# Patient Record
Sex: Female | Born: 1950
Health system: Southern US, Community
[De-identification: ages and names within clinical notes are randomized; demographics above are authoritative.]

## PROBLEM LIST (undated history)

## (undated) DIAGNOSIS — R112 Nausea with vomiting, unspecified: Secondary | ICD-10-CM

## (undated) DIAGNOSIS — F329 Major depressive disorder, single episode, unspecified: Secondary | ICD-10-CM

## (undated) DIAGNOSIS — F32A Depression, unspecified: Secondary | ICD-10-CM

## (undated) DIAGNOSIS — Z9889 Other specified postprocedural states: Secondary | ICD-10-CM

## (undated) DIAGNOSIS — I10 Essential (primary) hypertension: Secondary | ICD-10-CM

## (undated) HISTORY — PX: OTHER SURGICAL HISTORY: SHX169

## (undated) HISTORY — PX: MENISCUS REPAIR: SHX5179

## (undated) HISTORY — PX: GANGLION CYST EXCISION: SHX1691

## (undated) HISTORY — PX: ABDOMINAL HYSTERECTOMY: SHX81

## (undated) HISTORY — PX: FINGER SURGERY: SHX640

---

## 2002-10-31 ENCOUNTER — Encounter (HOSPITAL_COMMUNITY): Admission: RE | Admit: 2002-10-31 | Discharge: 2003-01-29 | Payer: Self-pay | Admitting: Internal Medicine

## 2002-11-01 ENCOUNTER — Encounter: Payer: Self-pay | Admitting: Internal Medicine

## 2011-01-04 ENCOUNTER — Ambulatory Visit
Admission: RE | Admit: 2011-01-04 | Discharge: 2011-01-04 | Payer: Self-pay | Source: Home / Self Care | Attending: Orthopedic Surgery | Admitting: Orthopedic Surgery

## 2011-01-05 LAB — POCT I-STAT, CHEM 8
BUN: 13 mg/dL (ref 6–23)
Calcium, Ion: 1.05 mmol/L — ABNORMAL LOW (ref 1.12–1.32)
Chloride: 110 mEq/L (ref 96–112)
Creatinine, Ser: 0.6 mg/dL (ref 0.4–1.2)
Glucose, Bld: 96 mg/dL (ref 70–99)
HCT: 38 % (ref 36.0–46.0)
Hemoglobin: 12.9 g/dL (ref 12.0–15.0)
Potassium: 4 mEq/L (ref 3.5–5.1)
Sodium: 139 mEq/L (ref 135–145)
TCO2: 24 mmol/L (ref 0–100)

## 2011-01-31 NOTE — Op Note (Signed)
NAME:  SOYLA, BAINTER NO.:  [id number]  MEDICAL RECORD NO.:  [id number]          PATIENT TYPE:  AMB  LOCATION:  DSC                          FACILITY:  [facility]  PHYSICIAN:  Cindee Salt, M.D.       DATE OF BIRTH:  [birth year]  DATE OF PROCEDURE:  01/04/2011 DATE OF DISCHARGE:                              OPERATIVE REPORT   PREOPERATIVE DIAGNOSIS:  Mucoid tumor, right index finger.  POSTOPERATIVE DIAGNOSIS:  Mucoid tumor, right index finger.  OPERATION:  Excisional biopsy of mucoid tumor with debridement of the distal interphalangeal joint, right index finger.  SURGEON:  Cindee Salt, MD  ASSISTANT:  Betha Loa, MD  ANESTHESIA:  Forearm-based IV regional with local infiltration, metacarpal block.  ANESTHESIOLOGIST:  Maren Beach, MD  HISTORY:  The patient is a 60 year old female with a large mass over the distal aspect of her right index finger radial side with significant deformity of her nail plate.  X-rays reveal degenerative changes of the distal interphalangeal joint, this does transilluminate.  She has elected to undergo surgical excision with debridement of the joint in an effort to try to prevent recurrent.  She is aware of risks and complications including infection, recurrence of injury to arteries, nerves, tendons, incomplete relief of symptoms, and dystrophy.  In the preoperative area, the patient was seen, the extremity was marked by both the patient and surgeon, and antibiotic given.  PROCEDURE:  The patient was brought to the operating room where a forearm-based IV regional anesthetic was carried out without difficulty under the direction of Dr. Katrinka Blazing.  She was prepped using ChloraPrep, supine position, right arm free.  A 3-minute dry time was allowed.  Time- out taken confirming the patient and procedure.  A curvilinear incision was made over the distal interphalangeal joint, radial aspect of the right index finger carried down through  the subcutaneous tissue. Bleeders were electrocauterized with bipolar.  A large cystic mass was medially encountered distally, this was followed distally, removed with a House curette and hemostat rongeur.  The joint was opened on its radial aspect.  A very significant synovitis with large osteophyte was present around the distal phalanx with some large osteophyte present over the dorsal aspect of the proximal phalanx.  These each were excised with the small rongeur until smooth level to the joint.  The large traction osteophyte on the extensor tendon was not disrupted.  The wound was copiously irrigated with saline.  Specimen was sent to pathology. The joint was then closed with figure-of-eight 6-0 chromic sutures and the skin with interrupted 5-0 Vicryl Rapide sutures.  Sterile compressive dressing, splint to the finger was applied.  On deflation of the tourniquet, all fingers were immediately pinked.  Prior to application of the dressing, a metacarpal block was given with 0.25% Marcaine without epinephrine, 5 mL was used.  The patient tolerated the procedure well and was taken to the recovery room for observation in satisfactory condition.  She will be discharged home to return to the [facility] in 1 week, on Vicodin.          ______________________________ Cindee Salt, M.D.  GK/MEDQ  D:  01/04/2011  T:  01/05/2011  Job:  [id number]  cc:   Dr. Edwinna Areola  Electronically Signed by Cindee Salt M.D. on 01/31/2011 02:24:03 PM

## 2014-12-12 HISTORY — PX: CHOLECYSTECTOMY: SHX55

## 2015-09-21 ENCOUNTER — Other Ambulatory Visit: Payer: Self-pay

## 2017-03-16 DIAGNOSIS — G8929 Other chronic pain: Secondary | ICD-10-CM | POA: Insufficient documentation

## 2017-03-16 DIAGNOSIS — M25511 Pain in right shoulder: Secondary | ICD-10-CM | POA: Insufficient documentation

## 2018-01-24 DIAGNOSIS — H6982 Other specified disorders of Eustachian tube, left ear: Secondary | ICD-10-CM | POA: Diagnosis not present

## 2018-02-01 DIAGNOSIS — Z8709 Personal history of other diseases of the respiratory system: Secondary | ICD-10-CM | POA: Diagnosis not present

## 2018-02-01 DIAGNOSIS — H938X2 Other specified disorders of left ear: Secondary | ICD-10-CM | POA: Diagnosis not present

## 2018-02-01 DIAGNOSIS — J342 Deviated nasal septum: Secondary | ICD-10-CM | POA: Diagnosis not present

## 2018-02-12 DIAGNOSIS — J0100 Acute maxillary sinusitis, unspecified: Secondary | ICD-10-CM | POA: Diagnosis not present

## 2018-02-12 DIAGNOSIS — J209 Acute bronchitis, unspecified: Secondary | ICD-10-CM | POA: Diagnosis not present

## 2018-03-08 DIAGNOSIS — M1712 Unilateral primary osteoarthritis, left knee: Secondary | ICD-10-CM | POA: Diagnosis not present

## 2018-03-08 DIAGNOSIS — G8929 Other chronic pain: Secondary | ICD-10-CM | POA: Diagnosis not present

## 2018-03-09 DIAGNOSIS — G8929 Other chronic pain: Secondary | ICD-10-CM | POA: Diagnosis not present

## 2018-03-09 DIAGNOSIS — M1712 Unilateral primary osteoarthritis, left knee: Secondary | ICD-10-CM | POA: Diagnosis not present

## 2018-03-12 HISTORY — PX: COLONOSCOPY: SHX174

## 2018-03-27 DIAGNOSIS — M21162 Varus deformity, not elsewhere classified, left knee: Secondary | ICD-10-CM | POA: Diagnosis not present

## 2018-03-27 DIAGNOSIS — M1712 Unilateral primary osteoarthritis, left knee: Secondary | ICD-10-CM | POA: Diagnosis not present

## 2018-03-27 DIAGNOSIS — G8929 Other chronic pain: Secondary | ICD-10-CM | POA: Diagnosis not present

## 2018-03-27 DIAGNOSIS — M25562 Pain in left knee: Secondary | ICD-10-CM | POA: Diagnosis not present

## 2018-04-05 DIAGNOSIS — Z1211 Encounter for screening for malignant neoplasm of colon: Secondary | ICD-10-CM | POA: Diagnosis not present

## 2018-04-05 DIAGNOSIS — F325 Major depressive disorder, single episode, in full remission: Secondary | ICD-10-CM | POA: Diagnosis not present

## 2018-04-05 DIAGNOSIS — M1712 Unilateral primary osteoarthritis, left knee: Secondary | ICD-10-CM | POA: Diagnosis not present

## 2018-04-05 DIAGNOSIS — Z23 Encounter for immunization: Secondary | ICD-10-CM | POA: Diagnosis not present

## 2018-04-05 DIAGNOSIS — Z79899 Other long term (current) drug therapy: Secondary | ICD-10-CM | POA: Diagnosis not present

## 2018-04-05 DIAGNOSIS — I10 Essential (primary) hypertension: Secondary | ICD-10-CM | POA: Diagnosis not present

## 2018-04-05 DIAGNOSIS — R195 Other fecal abnormalities: Secondary | ICD-10-CM | POA: Diagnosis not present

## 2018-04-05 DIAGNOSIS — E7801 Familial hypercholesterolemia: Secondary | ICD-10-CM | POA: Diagnosis not present

## 2018-04-10 DIAGNOSIS — K921 Melena: Secondary | ICD-10-CM | POA: Insufficient documentation

## 2018-04-10 DIAGNOSIS — Z6837 Body mass index (BMI) 37.0-37.9, adult: Secondary | ICD-10-CM | POA: Insufficient documentation

## 2018-04-12 DIAGNOSIS — Z79899 Other long term (current) drug therapy: Secondary | ICD-10-CM | POA: Diagnosis not present

## 2018-04-12 DIAGNOSIS — F329 Major depressive disorder, single episode, unspecified: Secondary | ICD-10-CM | POA: Diagnosis not present

## 2018-04-12 DIAGNOSIS — K921 Melena: Secondary | ICD-10-CM | POA: Diagnosis not present

## 2018-04-12 DIAGNOSIS — Z8371 Family history of colonic polyps: Secondary | ICD-10-CM | POA: Diagnosis not present

## 2018-04-12 DIAGNOSIS — E669 Obesity, unspecified: Secondary | ICD-10-CM | POA: Diagnosis not present

## 2018-04-12 DIAGNOSIS — Z6837 Body mass index (BMI) 37.0-37.9, adult: Secondary | ICD-10-CM | POA: Diagnosis not present

## 2018-04-12 DIAGNOSIS — K208 Other esophagitis: Secondary | ICD-10-CM | POA: Diagnosis not present

## 2018-04-12 DIAGNOSIS — K209 Esophagitis, unspecified: Secondary | ICD-10-CM | POA: Diagnosis not present

## 2018-04-30 ENCOUNTER — Other Ambulatory Visit: Payer: Self-pay | Admitting: Orthopedic Surgery

## 2018-05-23 NOTE — Pre-Procedure Instructions (Signed)
KELLSIE GRINDLE  05/23/2018      [facility] [place] [place] - [address] [place] [address] Phone: [phone] Fax: [phone]    Your procedure is scheduled on June 24  Report to [facility] Admitting at 0900 A.M.  Call this number if you have problems the morning of surgery:  [phone]   Remember:  NOTHING TO EAT OR DRINK AFTER MIDNIGHT    Take these medicines the morning of surgery with A SIP OF WATER  atenolol (TENORMIN) cetirizine (ZYRTEC)  sertraline (ZOLOFT)  7 days prior to surgery STOP taking any Aspirin(unless otherwise instructed by your surgeon), Aleve, Naproxen, Ibuprofen, Motrin, Advil, Goody's, BC's, all herbal medications, fish oil, and all vitamins   Do not wear jewelry, make-up or nail polish.  Do not wear lotions, powders, or perfumes, or deodorant.  Do not shave 48 hours prior to surgery.   Do not bring valuables to the hospital.  [facility] is not responsible for any belongings or valuables.  Contacts, dentures or bridgework may not be worn into surgery.  Leave your suitcase in the car.  After surgery it may be brought to your room.  For patients admitted to the hospital, discharge time will be determined by your treatment team.  Patients discharged the day of surgery will not be allowed to drive home.    Special instructions:   [place]- Preparing For Surgery  Before surgery, you can play an important role. Because skin is not sterile, your skin needs to be as free of germs as possible. You can reduce the number of germs on your skin by washing with CHG (chlorahexidine gluconate) Soap before surgery.  CHG is an antiseptic cleaner which kills germs and bonds with the skin to continue killing germs even after washing.    Oral Hygiene is also important to reduce your risk of infection.  Remember - BRUSH YOUR TEETH THE MORNING OF SURGERY WITH YOUR REGULAR TOOTHPASTE  Please do not use if you have  an allergy to CHG or antibacterial soaps. If your skin becomes reddened/irritated stop using the CHG.  Do not shave (including legs and underarms) for at least 48 hours prior to first CHG shower. It is OK to shave your face.  Please follow these instructions carefully.   1. Shower the NIGHT BEFORE SURGERY and the MORNING OF SURGERY with CHG.   2. If you chose to wash your hair, wash your hair first as usual with your normal shampoo.  3. After you shampoo, rinse your hair and body thoroughly to remove the shampoo.  4. Use CHG as you would any other liquid soap. You can apply CHG directly to the skin and wash gently with a scrungie or a clean washcloth.   5. Apply the CHG Soap to your body ONLY FROM THE NECK DOWN.  Do not use on open wounds or open sores. Avoid contact with your eyes, ears, mouth and genitals (private parts). Wash Face and genitals (private parts)  with your normal soap.  6. Wash thoroughly, paying special attention to the area where your surgery will be performed.  7. Thoroughly rinse your body with warm water from the neck down.  8. DO NOT shower/wash with your normal soap after using and rinsing off the CHG Soap.  9. Pat yourself dry with a CLEAN TOWEL.  10. Wear CLEAN PAJAMAS to bed the night before surgery, wear comfortable clothes the morning of surgery  11. Place  CLEAN SHEETS on your bed the night of your first shower and DO NOT SLEEP WITH PETS.    Day of Surgery:  Do not apply any deodorants/lotions.  Please wear clean clothes to the hospital/surgery center.   Remember to brush your teeth WITH YOUR REGULAR TOOTHPASTE.    Please read over the following fact sheets that you were given.

## 2018-05-24 ENCOUNTER — Encounter (HOSPITAL_COMMUNITY)
Admission: RE | Admit: 2018-05-24 | Discharge: 2018-05-24 | Disposition: A | Discharge status: Home / Self Care | Payer: Medicare Other | Source: Ambulatory Visit | Attending: Orthopedic Surgery | Admitting: Orthopedic Surgery

## 2018-05-24 ENCOUNTER — Other Ambulatory Visit: Payer: Self-pay

## 2018-05-24 ENCOUNTER — Encounter (HOSPITAL_COMMUNITY): Payer: Self-pay | Admitting: *Deleted

## 2018-05-24 DIAGNOSIS — M1712 Unilateral primary osteoarthritis, left knee: Secondary | ICD-10-CM | POA: Insufficient documentation

## 2018-05-24 DIAGNOSIS — Z01818 Encounter for other preprocedural examination: Secondary | ICD-10-CM | POA: Diagnosis not present

## 2018-05-24 DIAGNOSIS — Z01810 Encounter for preprocedural cardiovascular examination: Secondary | ICD-10-CM | POA: Insufficient documentation

## 2018-05-24 HISTORY — DX: Other specified postprocedural states: R11.2

## 2018-05-24 HISTORY — DX: Major depressive disorder, single episode, unspecified: F32.9

## 2018-05-24 HISTORY — DX: Essential (primary) hypertension: I10

## 2018-05-24 HISTORY — DX: Other specified postprocedural states: Z98.890

## 2018-05-24 HISTORY — DX: Depression, unspecified: F32.A

## 2018-05-24 LAB — COMPREHENSIVE METABOLIC PANEL
ALBUMIN: 4 g/dL (ref 3.5–5.0)
ALT: 23 U/L (ref 14–54)
AST: 23 U/L (ref 15–41)
Alkaline Phosphatase: 64 U/L (ref 38–126)
Anion gap: 6 (ref 5–15)
BUN: 13 mg/dL (ref 6–20)
CHLORIDE: 107 mmol/L (ref 101–111)
CO2: 27 mmol/L (ref 22–32)
Calcium: 9.2 mg/dL (ref 8.9–10.3)
Creatinine, Ser: 0.7 mg/dL (ref 0.44–1.00)
GFR calc Af Amer: >60 mL/min (ref >60)
GFR calc non Af Amer: >60 mL/min (ref >60)
GLUCOSE: 97 mg/dL (ref 65–99)
POTASSIUM: 4.4 mmol/L (ref 3.5–5.1)
Sodium: 140 mmol/L (ref 135–145)
Total Bilirubin: 1.1 mg/dL (ref 0.3–1.2)
Total Protein: 7.3 g/dL (ref 6.5–8.1)

## 2018-05-24 LAB — CBC WITH DIFFERENTIAL/PLATELET
Abs Immature Granulocytes: 0 10*3/uL (ref 0.0–0.1)
BASOS PCT: 1 %
Basophils Absolute: 0 10*3/uL (ref 0.0–0.1)
EOS ABS: 0.2 10*3/uL (ref 0.0–0.7)
Eosinophils Relative: 4 %
HCT: 41.2 % (ref 36.0–46.0)
Hemoglobin: 13.3 g/dL (ref 12.0–15.0)
IMMATURE GRANULOCYTES: 0 %
LYMPHS ABS: 1.7 10*3/uL (ref 0.7–4.0)
Lymphocytes Relative: 30 %
MCH: 30 pg (ref 26.0–34.0)
MCHC: 32.3 g/dL (ref 30.0–36.0)
MCV: 93 fL (ref 78.0–100.0)
Monocytes Absolute: 0.5 10*3/uL (ref 0.1–1.0)
Monocytes Relative: 9 %
NEUTROS PCT: 56 %
Neutro Abs: 3.2 10*3/uL (ref 1.7–7.7)
PLATELETS: 256 10*3/uL (ref 150–400)
RBC: 4.43 MIL/uL (ref 3.87–5.11)
RDW: 11.6 % (ref 11.5–15.5)
WBC: 5.6 10*3/uL (ref 4.0–10.5)

## 2018-05-24 LAB — SURGICAL PCR SCREEN
MRSA, PCR: NEGATIVE
Staphylococcus aureus: NEGATIVE

## 2018-05-24 NOTE — Progress Notes (Signed)
PCP:  Dr. Cecille Amsterdam in [place], [place]  No cardiologist

## 2018-05-25 NOTE — Progress Notes (Signed)
Anesthesia Chart Review:   Case:  [id number] Date/Time:  06/04/18 1100   Procedure:  TOTAL KNEE ARTHROPLASTY (Left )   Anesthesia type:  Spinal   Pre-op diagnosis:  primary osteoarthritis left knee   Location:  [facility] OR ROOM 06 / [facility]   Surgeon:  Vickey Huger, MD      DISCUSSION: - Pt is a 67 year old female with hx HTN.   VS: BP 130/73   Pulse (!) 56   Temp 36.8 C (Oral)   Resp 20   Ht 5\' 9"  (1.753 m)   Wt 248 lb 3.8 oz (112.6 kg)   SpO2 97%   BMI 36.66 kg/m   PROVIDERS: PCP is  [place], Marshall Cork., MD   LABS: Labs reviewed: Acceptable for surgery. (all labs ordered are listed, but only abnormal results are displayed)  Labs Reviewed  SURGICAL PCR SCREEN  CBC WITH DIFFERENTIAL/PLATELET  COMPREHENSIVE METABOLIC PANEL    EKG 9/98/33: Sinus bradycardia (57 bpm). Minimal voltage criteria for LVH, may be normal variant. Possible Septal infarct, age undetermined -- poor R wave progression may be due to lead placement   Past Medical History:  Diagnosis Date  . Depression   . Hypertension   . PONV (postoperative nausea and vomiting)     Past Surgical History:  Procedure Laterality Date  . ABDOMINAL HYSTERECTOMY    . bladder tack x2    . CHOLECYSTECTOMY  2016  . COLONOSCOPY  03/2018  . FINGER SURGERY Right    cyst removed from index finger  . GANGLION CYST EXCISION Left    wrist, forearm  . MENISCUS REPAIR Left     MEDICATIONS: . atenolol (TENORMIN) 50 MG tablet  . atorvastatin (LIPITOR) 10 MG tablet  . cetirizine (ZYRTEC) 10 MG tablet  . ibuprofen (ADVIL,MOTRIN) 200 MG tablet  . naphazoline-glycerin (CLEAR EYES REDNESS RELIEF) 0.012-0.2 % SOLN  . sertraline (ZOLOFT) 100 MG tablet   No current facility-administered medications for this encounter.     If no changes, I anticipate pt can proceed with surgery as scheduled.   Willeen Cass, FNP-BC [facility] Short Stay Surgical Center/Anesthesiology Phone: [phone] 05/25/2018 1:58 PM

## 2018-06-01 MED ORDER — TRANEXAMIC ACID 1000 MG/10ML IV SOLN
1000.0000 mg | INTRAVENOUS | Status: AC
  Administered 2018-06-04: 1000 mg via INTRAVENOUS
  Filled 2018-06-01: qty 1100

## 2018-06-01 MED ORDER — BUPIVACAINE LIPOSOME 1.3 % IJ SUSP
20.0000 mL | Freq: Once | INTRAMUSCULAR | Status: DC
  Filled 2018-06-01: qty 20

## 2018-06-04 ENCOUNTER — Ambulatory Visit (HOSPITAL_COMMUNITY): Payer: Medicare Other | Admitting: Emergency Medicine

## 2018-06-04 ENCOUNTER — Ambulatory Visit (HOSPITAL_COMMUNITY): Payer: Medicare Other | Admitting: Anesthesiology

## 2018-06-04 ENCOUNTER — Encounter (HOSPITAL_COMMUNITY): Payer: Self-pay | Admitting: *Deleted

## 2018-06-04 ENCOUNTER — Encounter (HOSPITAL_COMMUNITY)
Admission: RE | Disposition: A | Discharge status: Home / Self Care | Payer: Self-pay | Source: Ambulatory Visit | Attending: Orthopedic Surgery

## 2018-06-04 ENCOUNTER — Observation Stay (HOSPITAL_COMMUNITY)
Admission: RE | Admit: 2018-06-04 | Discharge: 2018-06-05 | Disposition: A | Discharge status: Home / Self Care | Payer: Medicare Other | Source: Ambulatory Visit | Attending: Orthopedic Surgery | Admitting: Orthopedic Surgery

## 2018-06-04 DIAGNOSIS — F329 Major depressive disorder, single episode, unspecified: Secondary | ICD-10-CM | POA: Insufficient documentation

## 2018-06-04 DIAGNOSIS — M1712 Unilateral primary osteoarthritis, left knee: Principal | ICD-10-CM | POA: Insufficient documentation

## 2018-06-04 DIAGNOSIS — G8918 Other acute postprocedural pain: Secondary | ICD-10-CM | POA: Diagnosis not present

## 2018-06-04 DIAGNOSIS — Z79899 Other long term (current) drug therapy: Secondary | ICD-10-CM | POA: Diagnosis not present

## 2018-06-04 DIAGNOSIS — I10 Essential (primary) hypertension: Secondary | ICD-10-CM | POA: Diagnosis not present

## 2018-06-04 DIAGNOSIS — Z96659 Presence of unspecified artificial knee joint: Secondary | ICD-10-CM

## 2018-06-04 HISTORY — PX: TOTAL KNEE ARTHROPLASTY: SHX125

## 2018-06-04 SURGERY — ARTHROPLASTY, KNEE, TOTAL
Anesthesia: Spinal | Site: Knee | Laterality: Left

## 2018-06-04 MED ORDER — PANTOPRAZOLE SODIUM 40 MG PO TBEC
40.0000 mg | DELAYED_RELEASE_TABLET | Freq: Every day | ORAL | Status: DC
  Administered 2018-06-04 – 2018-06-05 (×2): 40 mg via ORAL
  Filled 2018-06-04 (×2): qty 1

## 2018-06-04 MED ORDER — SENNOSIDES-DOCUSATE SODIUM 8.6-50 MG PO TABS: 1.0000 | ORAL_TABLET | Freq: Every evening | ORAL | Status: DC | PRN

## 2018-06-04 MED ORDER — SCOPOLAMINE 1 MG/3DAYS TD PT72
1.0000 | MEDICATED_PATCH | TRANSDERMAL | Status: DC
  Administered 2018-06-04: 1.5 mg via TRANSDERMAL

## 2018-06-04 MED ORDER — ASPIRIN EC 325 MG PO TBEC
325.0000 mg | DELAYED_RELEASE_TABLET | Freq: Two times a day (BID) | ORAL | Status: DC
Start: 2018-06-04 — End: 2018-06-05
  Administered 2018-06-04 – 2018-06-05 (×2): 325 mg via ORAL
  Filled 2018-06-04 (×2): qty 1

## 2018-06-04 MED ORDER — EPHEDRINE SULFATE 50 MG/ML IJ SOLN
INTRAMUSCULAR | Status: AC
  Filled 2018-06-04: qty 1

## 2018-06-04 MED ORDER — DOCUSATE SODIUM 100 MG PO CAPS
100.0000 mg | ORAL_CAPSULE | Freq: Two times a day (BID) | ORAL | Status: DC
  Administered 2018-06-04 – 2018-06-05 (×2): 100 mg via ORAL
  Filled 2018-06-04 (×2): qty 1

## 2018-06-04 MED ORDER — BUPIVACAINE LIPOSOME 1.3 % IJ SUSP
INTRAMUSCULAR | Status: DC | PRN
  Administered 2018-06-04: 20 mL

## 2018-06-04 MED ORDER — OXYCODONE HCL 5 MG PO TABS
5.0000 mg | ORAL_TABLET | ORAL | Status: DC | PRN
  Administered 2018-06-04 – 2018-06-05 (×3): 10 mg via ORAL
  Filled 2018-06-04 (×2): qty 2

## 2018-06-04 MED ORDER — GABAPENTIN 300 MG PO CAPS
300.0000 mg | ORAL_CAPSULE | Freq: Three times a day (TID) | ORAL | Status: DC
  Administered 2018-06-04 – 2018-06-05 (×4): 300 mg via ORAL
  Filled 2018-06-04 (×4): qty 1

## 2018-06-04 MED ORDER — CEFAZOLIN SODIUM-DEXTROSE 2-4 GM/100ML-% IV SOLN
2.0000 g | Freq: Four times a day (QID) | INTRAVENOUS | Status: AC
  Administered 2018-06-04 – 2018-06-05 (×2): 2 g via INTRAVENOUS
  Filled 2018-06-04 (×2): qty 100

## 2018-06-04 MED ORDER — BUPIVACAINE-EPINEPHRINE (PF) 0.25% -1:200000 IJ SOLN
INTRAMUSCULAR | Status: AC
  Filled 2018-06-04: qty 30

## 2018-06-04 MED ORDER — PROPOFOL 10 MG/ML IV BOLUS
INTRAVENOUS | Status: AC
  Filled 2018-06-04: qty 20

## 2018-06-04 MED ORDER — PHENYLEPHRINE 40 MCG/ML (10ML) SYRINGE FOR IV PUSH (FOR BLOOD PRESSURE SUPPORT)
PREFILLED_SYRINGE | INTRAVENOUS | Status: AC
  Filled 2018-06-04: qty 10

## 2018-06-04 MED ORDER — OXYCODONE HCL 5 MG PO TABS
ORAL_TABLET | ORAL | Status: AC
  Administered 2018-06-05: 10 mg via ORAL
  Filled 2018-06-04: qty 2

## 2018-06-04 MED ORDER — DIPHENHYDRAMINE HCL 12.5 MG/5ML PO ELIX: 12.5000 mg | ORAL_SOLUTION | ORAL | Status: DC | PRN

## 2018-06-04 MED ORDER — ROPIVACAINE HCL 7.5 MG/ML IJ SOLN
INTRAMUSCULAR | Status: DC | PRN
  Administered 2018-06-04: 20 mL via PERINEURAL

## 2018-06-04 MED ORDER — MIDAZOLAM HCL 2 MG/2ML IJ SOLN
INTRAMUSCULAR | Status: AC
  Filled 2018-06-04: qty 2

## 2018-06-04 MED ORDER — CHLORHEXIDINE GLUCONATE 4 % EX LIQD: 60.0000 mL | Freq: Once | CUTANEOUS | Status: DC

## 2018-06-04 MED ORDER — SCOPOLAMINE 1 MG/3DAYS TD PT72
MEDICATED_PATCH | TRANSDERMAL | Status: AC
  Filled 2018-06-04: qty 1

## 2018-06-04 MED ORDER — ONDANSETRON HCL 4 MG/2ML IJ SOLN
INTRAMUSCULAR | Status: AC
  Filled 2018-06-04: qty 2

## 2018-06-04 MED ORDER — HYDROMORPHONE HCL 2 MG/ML IJ SOLN: 0.5000 mg | INTRAMUSCULAR | Status: DC | PRN

## 2018-06-04 MED ORDER — BISACODYL 5 MG PO TBEC: 5.0000 mg | DELAYED_RELEASE_TABLET | Freq: Every day | ORAL | Status: DC | PRN

## 2018-06-04 MED ORDER — SERTRALINE HCL 100 MG PO TABS
100.0000 mg | ORAL_TABLET | Freq: Every day | ORAL | Status: DC
  Administered 2018-06-05: 100 mg via ORAL
  Filled 2018-06-04: qty 1

## 2018-06-04 MED ORDER — 0.9 % SODIUM CHLORIDE (POUR BTL) OPTIME
TOPICAL | Status: DC | PRN
  Administered 2018-06-04: 1000 mL

## 2018-06-04 MED ORDER — DEXAMETHASONE SODIUM PHOSPHATE 10 MG/ML IJ SOLN
8.0000 mg | Freq: Once | INTRAMUSCULAR | Status: AC
  Administered 2018-06-04: 8 mg via INTRAVENOUS
  Filled 2018-06-04: qty 1

## 2018-06-04 MED ORDER — ONDANSETRON HCL 4 MG/2ML IJ SOLN
4.0000 mg | Freq: Four times a day (QID) | INTRAMUSCULAR | Status: DC | PRN
Start: 2018-06-04 — End: 2018-06-05

## 2018-06-04 MED ORDER — ATENOLOL 50 MG PO TABS
50.0000 mg | ORAL_TABLET | Freq: Every day | ORAL | Status: DC
  Administered 2018-06-05: 50 mg via ORAL
  Filled 2018-06-04: qty 1

## 2018-06-04 MED ORDER — BUPIVACAINE-EPINEPHRINE (PF) 0.25% -1:200000 IJ SOLN
INTRAMUSCULAR | Status: DC | PRN
  Administered 2018-06-04: 30 mL

## 2018-06-04 MED ORDER — FENTANYL CITRATE (PF) 100 MCG/2ML IJ SOLN
INTRAMUSCULAR | Status: AC
  Filled 2018-06-04: qty 2

## 2018-06-04 MED ORDER — PROPOFOL 500 MG/50ML IV EMUL
INTRAVENOUS | Status: DC | PRN
  Administered 2018-06-04: 100 ug/kg/min via INTRAVENOUS

## 2018-06-04 MED ORDER — FERROUS SULFATE 325 (65 FE) MG PO TABS
325.0000 mg | ORAL_TABLET | Freq: Three times a day (TID) | ORAL | Status: DC
Start: 2018-06-04 — End: 2018-06-05
  Administered 2018-06-04 – 2018-06-05 (×4): 325 mg via ORAL
  Filled 2018-06-04 (×4): qty 1

## 2018-06-04 MED ORDER — SODIUM CHLORIDE 0.9 % IR SOLN
Status: DC | PRN
  Administered 2018-06-04: 3000 mL

## 2018-06-04 MED ORDER — FENTANYL CITRATE (PF) 250 MCG/5ML IJ SOLN
INTRAMUSCULAR | Status: AC
Start: 2018-06-04 — End: ?
  Filled 2018-06-04: qty 5

## 2018-06-04 MED ORDER — PHENYLEPHRINE HCL 10 MG/ML IJ SOLN
INTRAVENOUS | Status: DC | PRN
  Administered 2018-06-04: 50 ug/min via INTRAVENOUS

## 2018-06-04 MED ORDER — METOCLOPRAMIDE HCL 5 MG PO TABS: 5.0000 mg | ORAL_TABLET | Freq: Three times a day (TID) | ORAL | Status: DC | PRN

## 2018-06-04 MED ORDER — SODIUM CHLORIDE 0.9 % IJ SOLN
INTRAMUSCULAR | Status: DC | PRN
  Administered 2018-06-04: 20 mL

## 2018-06-04 MED ORDER — ROCURONIUM BROMIDE 50 MG/5ML IV SOLN
INTRAVENOUS | Status: AC
  Filled 2018-06-04: qty 1

## 2018-06-04 MED ORDER — ONDANSETRON HCL 4 MG PO TABS: 4.0000 mg | ORAL_TABLET | Freq: Four times a day (QID) | ORAL | Status: DC | PRN

## 2018-06-04 MED ORDER — METOCLOPRAMIDE HCL 5 MG/ML IJ SOLN: 5.0000 mg | Freq: Three times a day (TID) | INTRAMUSCULAR | Status: DC | PRN

## 2018-06-04 MED ORDER — ACETAMINOPHEN 500 MG PO TABS
1000.0000 mg | ORAL_TABLET | Freq: Once | ORAL | Status: AC
  Administered 2018-06-04: 1000 mg via ORAL
  Filled 2018-06-04: qty 2

## 2018-06-04 MED ORDER — ALUM & MAG HYDROXIDE-SIMETH 200-200-20 MG/5ML PO SUSP: 30.0000 mL | ORAL | Status: DC | PRN

## 2018-06-04 MED ORDER — FENTANYL CITRATE (PF) 100 MCG/2ML IJ SOLN
50.0000 ug | Freq: Once | INTRAMUSCULAR | Status: AC
  Administered 2018-06-04: 50 ug via INTRAVENOUS

## 2018-06-04 MED ORDER — LACTATED RINGERS IV SOLN
INTRAVENOUS | Status: DC
  Administered 2018-06-04 (×3): via INTRAVENOUS

## 2018-06-04 MED ORDER — HYDROMORPHONE HCL 1 MG/ML IJ SOLN
INTRAMUSCULAR | Status: AC
  Filled 2018-06-04: qty 1

## 2018-06-04 MED ORDER — MENTHOL 3 MG MT LOZG: 1.0000 | LOZENGE | OROMUCOSAL | Status: DC | PRN

## 2018-06-04 MED ORDER — MIDAZOLAM HCL 5 MG/5ML IJ SOLN
INTRAMUSCULAR | Status: DC | PRN
  Administered 2018-06-04: 2 mg via INTRAVENOUS

## 2018-06-04 MED ORDER — TRANEXAMIC ACID 1000 MG/10ML IV SOLN
1000.0000 mg | Freq: Once | INTRAVENOUS | Status: AC
  Administered 2018-06-04: 1000 mg via INTRAVENOUS
  Filled 2018-06-04: qty 10

## 2018-06-04 MED ORDER — MIDAZOLAM HCL 2 MG/2ML IJ SOLN
1.0000 mg | Freq: Once | INTRAMUSCULAR | Status: AC
  Administered 2018-06-04: 1 mg via INTRAVENOUS

## 2018-06-04 MED ORDER — ATORVASTATIN CALCIUM 10 MG PO TABS
10.0000 mg | ORAL_TABLET | Freq: Every day | ORAL | Status: DC
Start: 2018-06-04 — End: 2018-06-05
  Administered 2018-06-04 – 2018-06-05 (×2): 10 mg via ORAL
  Filled 2018-06-04 (×2): qty 1

## 2018-06-04 MED ORDER — SUCCINYLCHOLINE CHLORIDE 20 MG/ML IJ SOLN
INTRAMUSCULAR | Status: AC
  Filled 2018-06-04: qty 1

## 2018-06-04 MED ORDER — METHOCARBAMOL 500 MG PO TABS
ORAL_TABLET | ORAL | Status: AC
  Filled 2018-06-04: qty 1

## 2018-06-04 MED ORDER — TRAMADOL HCL 50 MG PO TABS
50.0000 mg | ORAL_TABLET | Freq: Four times a day (QID) | ORAL | Status: DC
Start: 2018-06-04 — End: 2018-06-05
  Administered 2018-06-04 – 2018-06-05 (×5): 50 mg via ORAL
  Filled 2018-06-04 (×5): qty 1

## 2018-06-04 MED ORDER — ZOLPIDEM TARTRATE 5 MG PO TABS: 5.0000 mg | ORAL_TABLET | Freq: Every evening | ORAL | Status: DC | PRN

## 2018-06-04 MED ORDER — CEFAZOLIN SODIUM-DEXTROSE 2-4 GM/100ML-% IV SOLN
2.0000 g | INTRAVENOUS | Status: AC
  Administered 2018-06-04: 2 g via INTRAVENOUS
  Filled 2018-06-04: qty 100

## 2018-06-04 MED ORDER — METHOCARBAMOL 500 MG PO TABS
500.0000 mg | ORAL_TABLET | Freq: Four times a day (QID) | ORAL | Status: DC | PRN
  Administered 2018-06-04: 500 mg via ORAL
  Filled 2018-06-04: qty 1

## 2018-06-04 MED ORDER — GABAPENTIN 300 MG PO CAPS
300.0000 mg | ORAL_CAPSULE | Freq: Once | ORAL | Status: AC
  Administered 2018-06-04: 300 mg via ORAL
  Filled 2018-06-04: qty 1

## 2018-06-04 MED ORDER — FLEET ENEMA 7-19 GM/118ML RE ENEM: 1.0000 | ENEMA | Freq: Once | RECTAL | Status: DC | PRN

## 2018-06-04 MED ORDER — PHENOL 1.4 % MT LIQD: 1.0000 | OROMUCOSAL | Status: DC | PRN

## 2018-06-04 MED ORDER — BUPIVACAINE IN DEXTROSE 0.75-8.25 % IT SOLN
INTRATHECAL | Status: DC | PRN
  Administered 2018-06-04: 15 mg via INTRATHECAL

## 2018-06-04 MED ORDER — DEXAMETHASONE SODIUM PHOSPHATE 10 MG/ML IJ SOLN
10.0000 mg | Freq: Once | INTRAMUSCULAR | Status: AC
  Administered 2018-06-05: 10 mg via INTRAVENOUS
  Filled 2018-06-04: qty 1

## 2018-06-04 MED ORDER — METHOCARBAMOL 1000 MG/10ML IJ SOLN
500.0000 mg | Freq: Four times a day (QID) | INTRAVENOUS | Status: DC | PRN
  Filled 2018-06-04: qty 5

## 2018-06-04 MED ORDER — ONDANSETRON HCL 4 MG/2ML IJ SOLN
INTRAMUSCULAR | Status: DC | PRN
  Administered 2018-06-04: 4 mg via INTRAVENOUS

## 2018-06-04 MED ORDER — HYDROMORPHONE HCL 1 MG/ML IJ SOLN
0.2500 mg | INTRAMUSCULAR | Status: DC | PRN
  Administered 2018-06-04: 0.5 mg via INTRAVENOUS

## 2018-06-04 MED ORDER — ACETAMINOPHEN 500 MG PO TABS
1000.0000 mg | ORAL_TABLET | Freq: Four times a day (QID) | ORAL | Status: AC
  Administered 2018-06-04 – 2018-06-05 (×4): 1000 mg via ORAL
  Filled 2018-06-04 (×4): qty 2

## 2018-06-04 SURGICAL SUPPLY — 68 items
ARTISURF 11M PLY L 6-9EF KNEE (Knees) ×3 IMPLANT
BANDAGE ACE 6X5 VEL STRL LF (GAUZE/BANDAGES/DRESSINGS) ×3 IMPLANT
BANDAGE ESMARK 6X9 LF (GAUZE/BANDAGES/DRESSINGS) ×1 IMPLANT
BLADE SAGITTAL 13X1.27X60 (BLADE) ×2 IMPLANT
BLADE SAGITTAL 13X1.27X60MM (BLADE) ×1
BLADE SAW SGTL 83.5X18.5 (BLADE) ×3 IMPLANT
BLADE SURG 10 STRL SS (BLADE) ×3 IMPLANT
BNDG ESMARK 6X9 LF (GAUZE/BANDAGES/DRESSINGS) ×3
BOWL SMART MIX CTS (DISPOSABLE) ×3 IMPLANT
CEMENT BONE SIMPLEX SPEEDSET (Cement) ×6 IMPLANT
CLOSURE STERI-STRIP 1/2X4 (GAUZE/BANDAGES/DRESSINGS) ×1
CLOSURE WOUND 1/2 X4 (GAUZE/BANDAGES/DRESSINGS) ×1
CLSR STERI-STRIP ANTIMIC 1/2X4 (GAUZE/BANDAGES/DRESSINGS) ×2 IMPLANT
COVER SURGICAL LIGHT HANDLE (MISCELLANEOUS) ×3 IMPLANT
CUFF TOURNIQUET SINGLE 34IN LL (TOURNIQUET CUFF) ×3 IMPLANT
DRAPE EXTREMITY T 121X128X90 (DRAPE) ×3 IMPLANT
DRAPE HALF SHEET 40X57 (DRAPES) ×3 IMPLANT
DRAPE INCISE IOBAN 66X45 STRL (DRAPES) ×6 IMPLANT
DRAPE U-SHAPE 47X51 STRL (DRAPES) ×3 IMPLANT
DRSG AQUACEL AG ADV 3.5X 6 (GAUZE/BANDAGES/DRESSINGS) ×3 IMPLANT
DRSG AQUACEL AG ADV 3.5X10 (GAUZE/BANDAGES/DRESSINGS) ×3 IMPLANT
DURAPREP 26ML APPLICATOR (WOUND CARE) ×6 IMPLANT
ELECT REM PT RETURN 9FT ADLT (ELECTROSURGICAL) ×3
ELECTRODE REM PT RTRN 9FT ADLT (ELECTROSURGICAL) ×1 IMPLANT
FEMUR  CMT CCR STD SZ7 L KNEE (Knees) ×2 IMPLANT
FEMUR CMT CCR STD SZ7 L KNEE (Knees) ×1 IMPLANT
FEMUR CMTD CCR STD SZ7 L KNEE (Knees) ×1 IMPLANT
GLOVE BIOGEL M 7.0 STRL (GLOVE) IMPLANT
GLOVE BIOGEL PI IND STRL 7.5 (GLOVE) IMPLANT
GLOVE BIOGEL PI IND STRL 8.5 (GLOVE) ×1 IMPLANT
GLOVE BIOGEL PI INDICATOR 7.5 (GLOVE)
GLOVE BIOGEL PI INDICATOR 8.5 (GLOVE) ×2
GLOVE SURG ORTHO 8.0 STRL STRW (GLOVE) ×6 IMPLANT
GOWN STRL REUS W/ TWL LRG LVL3 (GOWN DISPOSABLE) ×1 IMPLANT
GOWN STRL REUS W/ TWL XL LVL3 (GOWN DISPOSABLE) ×2 IMPLANT
GOWN STRL REUS W/TWL 2XL LVL3 (GOWN DISPOSABLE) ×3 IMPLANT
GOWN STRL REUS W/TWL LRG LVL3 (GOWN DISPOSABLE) ×2
GOWN STRL REUS W/TWL XL LVL3 (GOWN DISPOSABLE) ×4
HANDPIECE INTERPULSE COAX TIP (DISPOSABLE) ×2
HOOD PEEL AWAY FACE SHEILD DIS (HOOD) ×9 IMPLANT
KIT BASIN OR (CUSTOM PROCEDURE TRAY) ×3 IMPLANT
KIT TURNOVER KIT B (KITS) ×3 IMPLANT
MANIFOLD NEPTUNE II (INSTRUMENTS) ×3 IMPLANT
NEEDLE 18GX1X1/2 (RX/OR ONLY) (NEEDLE) IMPLANT
NEEDLE 22X1 1/2 (OR ONLY) (NEEDLE) ×6 IMPLANT
NS IRRIG 1000ML POUR BTL (IV SOLUTION) ×3 IMPLANT
PACK TOTAL JOINT (CUSTOM PROCEDURE TRAY) ×3 IMPLANT
PAD ARMBOARD 7.5X6 YLW CONV (MISCELLANEOUS) ×6 IMPLANT
SET HNDPC FAN SPRY TIP SCT (DISPOSABLE) ×1 IMPLANT
STEM POLY PAT PLY 35M KNEE (Knees) ×3 IMPLANT
STEM TIBIA 5 DEG SZ E L KNEE (Knees) ×1 IMPLANT
STRIP CLOSURE SKIN 1/2X4 (GAUZE/BANDAGES/DRESSINGS) ×2 IMPLANT
SUCTION FRAZIER HANDLE 10FR (MISCELLANEOUS)
SUCTION TUBE FRAZIER 10FR DISP (MISCELLANEOUS) IMPLANT
SUT BONE WAX W31G (SUTURE) ×3 IMPLANT
SUT MNCRL AB 3-0 PS2 18 (SUTURE) ×3 IMPLANT
SUT VIC AB 0 CTB1 27 (SUTURE) ×3 IMPLANT
SUT VIC AB 1 CT1 27 (SUTURE) ×4
SUT VIC AB 1 CT1 27XBRD ANBCTR (SUTURE) ×2 IMPLANT
SUT VIC AB 2-0 CT1 27 (SUTURE) ×4
SUT VIC AB 2-0 CT1 TAPERPNT 27 (SUTURE) ×2 IMPLANT
SUT VLOC 180 0 24IN GS25 (SUTURE) ×3 IMPLANT
SYR 20CC LL (SYRINGE) ×6 IMPLANT
TIBIA STEM 5 DEG SZ E L KNEE (Knees) ×3 IMPLANT
TOWEL OR 17X24 6PK STRL BLUE (TOWEL DISPOSABLE) ×3 IMPLANT
TOWEL OR 17X26 10 PK STRL BLUE (TOWEL DISPOSABLE) ×3 IMPLANT
TRAY CATH 16FR W/PLASTIC CATH (SET/KITS/TRAYS/PACK) IMPLANT
WRAP KNEE MAXI GEL POST OP (GAUZE/BANDAGES/DRESSINGS) ×3 IMPLANT

## 2018-06-04 NOTE — Anesthesia Procedure Notes (Signed)
Spinal  Patient location during procedure: OR Start time: 06/04/2018 11:00 AM End time: 06/04/2018 11:05 AM Staffing Anesthesiologist: Roderic Palau, MD Performed: anesthesiologist  Preanesthetic Checklist Completed: patient identified, surgical consent, pre-op evaluation, timeout performed, IV checked, risks and benefits discussed and monitors and equipment checked Spinal Block Patient position: sitting Prep: DuraPrep Patient monitoring: cardiac monitor, continuous pulse ox and blood pressure Approach: midline Location: L3-4 Injection technique: single-shot Needle Needle type: Pencan  Needle gauge: 24 G Needle length: 9 cm Assessment Sensory level: T8 Additional Notes Functioning IV was confirmed and monitors were applied. Sterile prep and drape, including hand hygiene and sterile gloves were used. The patient was positioned and the spine was prepped. The skin was anesthetized with lidocaine.  Free flow of clear CSF was obtained prior to injecting local anesthetic into the CSF.  The spinal needle aspirated freely following injection.  The needle was carefully withdrawn.  The patient tolerated the procedure well.

## 2018-06-04 NOTE — Evaluation (Signed)
Physical Therapy Evaluation Patient Details Name: Tamara Barber MRN: [id number] DOB: [birth year] Today's Date: 06/04/2018   History of Present Illness  Pt is a 67 y/o female s/p elective L TKA. PMH includes HTN and depression.   Clinical Impression  Pt is s/p surgery above with deficits below. Pt limited this session secondary to pain and knee instability and only able to tolerate side stepping at EOB. Required min A for mobility using RW. Reviewed supine HEP and knee precautions. Will continue to follow acutely to maximize functional mobility independence and safety.     Follow Up Recommendations Follow surgeon's recommendation for DC plan and follow-up therapies;Supervision for mobility/OOB    Equipment Recommendations  None recommended by PT    Recommendations for Other Services OT consult     Precautions / Restrictions Precautions Precautions: Knee Precaution Booklet Issued: Yes (comment) Precaution Comments: Reviewed supine HEP and knee precautions.  Restrictions Weight Bearing Restrictions: Yes LLE Weight Bearing: Weight bearing as tolerated      Mobility  Bed Mobility Overal bed mobility: Needs Assistance Bed Mobility: Supine to Sit;Sit to Supine     Supine to sit: Min assist Sit to supine: Min assist   General bed mobility comments: Min A for LLE assist throughout bed mobility.   Transfers Overall transfer level: Needs assistance Equipment used: Rolling walker (2 wheeled) Transfers: Sit to/from Stand Sit to Stand: Min assist;From elevated surface         General transfer comment: Min A for lift assist and steadying from elevated bed height. Verbal cues for hand placement.   Ambulation/Gait Ambulation/Gait assistance: Min assist Gait Distance (Feet): 1 Feet Assistive device: Rolling walker (2 wheeled)   Gait velocity: Decreased    General Gait Details: Able to take a few side steps at EOB. Increased pain and decreased L knee stability prevented  further mobiltiy. Verbal cues for sequencing using RW.   Stairs            Wheelchair Mobility    Modified Rankin (Stroke Patients Only)       Balance Overall balance assessment: Needs assistance Sitting-balance support: No upper extremity supported;Feet supported Sitting balance-Leahy Scale: Fair     Standing balance support: Bilateral upper extremity supported;During functional activity Standing balance-Leahy Scale: Poor Standing balance comment: Reliant on BUE support                              Pertinent Vitals/Pain Pain Assessment: 0-10 Pain Score: 8  Pain Location: L knee  Pain Descriptors / Indicators: Aching;Operative site guarding Pain Intervention(s): Limited activity within patient's tolerance;Monitored during session;Repositioned    Home Living Family/patient expects to be discharged to:: Private residence Living Arrangements: Spouse/significant other Available Help at Discharge: Family;Available 24 hours/day Type of Home: House Home Access: Stairs to enter;Level entry Entrance Stairs-Rails: None(none on the first set, bilat rails on the second set ) Entrance Stairs-Number of Steps: 15(7, then 8) Home Layout: One level Home Equipment: Walker - 2 wheels;Bedside commode;Tub bench      Prior Function Level of Independence: Independent               Hand Dominance        Extremity/Trunk Assessment   Upper Extremity Assessment Upper Extremity Assessment: Defer to OT evaluation    Lower Extremity Assessment Lower Extremity Assessment: LLE deficits/detail LLE Deficits / Details: REports decreased sensation. Deficits consistent with post op pain and weakness. Able to perform ther ex  below.     Cervical / Trunk Assessment Cervical / Trunk Assessment: Normal  Communication   Communication: No difficulties  Cognition Arousal/Alertness: Suspect due to medications Behavior During Therapy: WFL for tasks assessed/performed Overall  Cognitive Status: Within Functional Limits for tasks assessed                                 General Comments: Pt sleepy after receiving pain meds. Easily arousable.       General Comments General comments (skin integrity, edema, etc.): Pt's husband present during session.     Exercises Total Joint Exercises Ankle Circles/Pumps: AROM;Both;20 reps Quad Sets: AROM;Left;10 reps   Assessment/Plan    PT Assessment Patient needs continued PT services  PT Problem List Decreased strength;Decreased range of motion;Decreased balance;Decreased mobility;Decreased knowledge of use of DME;Decreased knowledge of precautions;Decreased activity tolerance;Pain;Impaired sensation       PT Treatment Interventions DME instruction;Gait training;Stair training;Functional mobility training;Therapeutic activities;Therapeutic exercise;Balance training;Patient/family education    PT Goals (Current goals can be found in the Care Plan section)  Acute Rehab PT Goals Patient Stated Goal: to go home  PT Goal Formulation: With patient Time For Goal Achievement: 06/18/18 Potential to Achieve Goals: Good    Frequency 7X/week   Barriers to discharge        Co-evaluation               AM-PAC PT "6 Clicks" Daily Activity  Outcome Measure Difficulty turning over in bed (including adjusting bedclothes, sheets and blankets)?: A Little Difficulty moving from lying on back to sitting on the side of the bed? : Unable Difficulty sitting down on and standing up from a chair with arms (e.g., wheelchair, bedside commode, etc,.)?: Unable Help needed moving to and from a bed to chair (including a wheelchair)?: A Lot Help needed walking in hospital room?: A Lot Help needed climbing 3-5 steps with a railing? : Total 6 Click Score: 10    End of Session Equipment Utilized During Treatment: Gait belt Activity Tolerance: Patient limited by pain;Patient limited by lethargy Patient left: in bed;with  call bell/phone within reach;with family/visitor present Nurse Communication: Mobility status PT Visit Diagnosis: Unsteadiness on feet (R26.81);Other abnormalities of gait and mobility (R26.89);Pain Pain - Right/Left: Left Pain - part of body: Knee    Time: 1497-0263 PT Time Calculation (min) (ACUTE ONLY): 26 min   Charges:   PT Evaluation $PT Eval Moderate Complexity: 1 Mod PT Treatments $Therapeutic Activity: 8-22 mins   PT G Codes:        Leighton Ruff, PT, DPT  Acute Rehabilitation Services  Pager: [phone]   Rudean Hitt 06/04/2018, 7:09 PM

## 2018-06-04 NOTE — Progress Notes (Signed)
Orthopedic Tech Progress Note Patient Details:  Tamara Barber [birth year] [id number]  CPM Left Knee CPM Left Knee: On Left Knee Flexion (Degrees): 90 Left Knee Extension (Degrees): 0 Additional Comments: Trapeze bar and foot roll  Post Interventions Patient Tolerated: Well Instructions Provided: Care of device, Adjustment of device  [place] Pink 06/04/2018, 2:14 PM

## 2018-06-04 NOTE — Anesthesia Preprocedure Evaluation (Addendum)
Anesthesia Evaluation  Patient identified by MRN, date of birth, ID band Patient awake    Reviewed: Allergy & Precautions, H&P , NPO status , Patient's Chart, lab work & pertinent test results, reviewed documented beta blocker date and time   History of Anesthesia Complications (+) PONV  Airway Mallampati: III  TM Distance: >3 FB Neck ROM: Full    Dental no notable dental hx. (+) Teeth Intact, Dental Advisory Given   Pulmonary neg pulmonary ROS,    Pulmonary exam normal breath sounds clear to auscultation       Cardiovascular hypertension, Pt. on medications and Pt. on home beta blockers  Rhythm:Regular Rate:Normal     Neuro/Psych Depression negative neurological ROS     GI/Hepatic negative GI ROS, Neg liver ROS,   Endo/Other  Morbid obesity  Renal/GU negative Renal ROS  negative genitourinary   Musculoskeletal   Abdominal   Peds  Hematology negative hematology ROS (+)   Anesthesia Other Findings   Reproductive/Obstetrics negative OB ROS                            Anesthesia Physical Anesthesia Plan  ASA: III  Anesthesia Plan: Spinal   Post-op Pain Management:  Regional for Post-op pain   Induction: Intravenous  PONV Risk Score and Plan: 4 or greater and Ondansetron, Dexamethasone, Propofol infusion, Midazolam and Scopolamine patch - Pre-op  Airway Management Planned: Simple Face Mask  Additional Equipment:   Intra-op Plan:   Post-operative Plan:   Informed Consent: I have reviewed the patients History and Physical, chart, labs and discussed the procedure including the risks, benefits and alternatives for the proposed anesthesia with the patient or authorized representative who has indicated his/her understanding and acceptance.   Dental advisory given  Plan Discussed with: CRNA  Anesthesia Plan Comments:        Anesthesia Quick Evaluation

## 2018-06-04 NOTE — Transfer of Care (Signed)
Immediate Anesthesia Transfer of Care Note  Patient: Tamara Barber  Procedure(s) Performed: TOTAL KNEE ARTHROPLASTY (Left Knee)  Patient Location: PACU  Anesthesia Type:Spinal and MAC combined with regional for post-op pain  Level of Consciousness: awake, alert  and oriented  Airway & Oxygen Therapy: Patient Spontanous Breathing  Post-op Assessment: Report given to RN and Post -op Vital signs reviewed and stable  Post vital signs: Reviewed and stable  Last Vitals:  Vitals Value Taken Time  BP 129/64 06/04/2018  1:03 PM  Temp 36.2 C 06/04/2018  1:03 PM  Pulse 60 06/04/2018  1:03 PM  Resp 14 06/04/2018  1:03 PM  SpO2 91 % 06/04/2018  1:03 PM  Vitals shown include unvalidated device data.  Last Pain:  Vitals:   06/04/18 1303  TempSrc:   PainSc: (P) 0-No pain         Complications: No apparent anesthesia complications

## 2018-06-04 NOTE — H&P (Signed)
Tamara Barber MRN:  [id number] DOB/SEX:  [birth year]/female  CHIEF COMPLAINT:  Painful left Knee  HISTORY: Patient is a 67 y.o. female presented with a history of pain in the left knee. Onset of symptoms was gradual starting a few years ago with gradually worsening course since that time. Patient has been treated conservatively with over-the-counter NSAIDs and activity modification. Patient currently rates pain in the knee at 10 out of 10 with activity. There is pain at night.  PAST MEDICAL HISTORY: There are no active problems to display for this patient.  Past Medical History:  Diagnosis Date  . Depression   . Hypertension   . PONV (postoperative nausea and vomiting)    Past Surgical History:  Procedure Laterality Date  . ABDOMINAL HYSTERECTOMY    . bladder tack x2    . CHOLECYSTECTOMY  2016  . COLONOSCOPY  03/2018  . FINGER SURGERY Right    cyst removed from index finger  . GANGLION CYST EXCISION Left    wrist, forearm  . MENISCUS REPAIR Left      MEDICATIONS:   Medications Prior to Admission  Medication Sig Dispense Refill Last Dose  . atenolol (TENORMIN) 50 MG tablet Take 50 mg by mouth daily.   06/04/2018 at 0630  . atorvastatin (LIPITOR) 10 MG tablet Take 10 mg by mouth daily.   06/03/2018 at Unknown time  . cetirizine (ZYRTEC) 10 MG tablet Take 10 mg by mouth daily.   06/04/2018 at Unknown time  . ibuprofen (ADVIL,MOTRIN) 200 MG tablet Take 800 mg by mouth every 8 (eight) hours as needed (for pain.).   > 1 week  . naphazoline-glycerin (CLEAR EYES REDNESS RELIEF) 0.012-0.2 % SOLN Place 1 drop into both eyes 4 (four) times daily as needed (for redness relief).   06/01/2018  . sertraline (ZOLOFT) 100 MG tablet Take 100 mg by mouth daily.   06/04/2018 at Unknown time    ALLERGIES:  No Known Allergies  REVIEW OF SYSTEMS:  A comprehensive review of systems was negative except for: Musculoskeletal: positive for arthralgias and bone pain   FAMILY HISTORY:  History reviewed.  No pertinent family history.  SOCIAL HISTORY:   Social History   Tobacco Use  . Smoking status: Never Smoker  . Smokeless tobacco: Never Used  Substance Use Topics  . Alcohol use: Not Currently     EXAMINATION:  Vital signs in last 24 hours: Temp:  [97.6 F (36.4 C)] 97.6 F (36.4 C) (06/24 0755) Pulse Rate:  [63] 63 (06/24 0755) Resp:  [18] 18 (06/24 0755) BP: (160)/(70) 160/70 (06/24 0755) SpO2:  [97 %] 97 % (06/24 0755)  BP (!) 160/70   Pulse 63   Temp 97.6 F (36.4 C) (Oral)   Resp 18   SpO2 97%   General Appearance:    Alert, cooperative, no distress, appears stated age  Head:    Normocephalic, without obvious abnormality, atraumatic  Eyes:    PERRL, conjunctiva/corneas clear, EOM's intact, fundi    benign, both eyes  Ears:    Normal TM's and external ear canals, both ears  Nose:   Nares normal, septum midline, mucosa normal, no drainage    or sinus tenderness  Throat:   Lips, mucosa, and tongue normal; teeth and gums normal  Neck:   Supple, symmetrical, trachea midline, no adenopathy;    thyroid:  no enlargement/tenderness/nodules; no carotid   bruit or JVD  Back:     Symmetric, no curvature, ROM normal, no CVA tenderness  Lungs:  Clear to auscultation bilaterally, respirations unlabored  Chest Wall:    No tenderness or deformity   Heart:    Regular rate and rhythm, S1 and S2 normal, no murmur, rub   or gallop  Breast Exam:    No tenderness, masses, or nipple abnormality  Abdomen:     Soft, non-tender, bowel sounds active all four quadrants,    no masses, no organomegaly  Genitalia:    Normal female without lesion, discharge or tenderness  Rectal:    Normal tone, no masses or tenderness;   guaiac negative stool  Extremities:   Extremities normal, atraumatic, no cyanosis or edema  Pulses:   2+ and symmetric all extremities  Skin:   Skin color, texture, turgor normal, no rashes or lesions  Lymph nodes:   Cervical, supraclavicular, and axillary nodes normal   Neurologic:   CNII-XII intact, normal strength, sensation and reflexes    throughout    Musculoskeletal:  ROM 0-120, Ligaments intact,  Imaging Review Plain radiographs demonstrate severe degenerative joint disease of the left knee. The overall alignment is neutral. The bone quality appears to be good for age and reported activity level.  Assessment/Plan: Primary osteoarthritis, left knee   The patient history, physical examination and imaging studies are consistent with advanced degenerative joint disease of the left knee. The patient has failed conservative treatment.  The clearance notes were reviewed.  After discussion with the patient it was felt that Total Knee Replacement was indicated. The procedure,  risks, and benefits of total knee arthroplasty were presented and reviewed. The risks including but not limited to aseptic loosening, infection, blood clots, vascular injury, stiffness, patella tracking problems complications among others were discussed. The patient acknowledged the explanation, agreed to proceed with the plan.  Preoperative templating of the joint replacement has been completed, documented, and submitted to the Operating Room personnel in order to optimize intra-operative equipment management.    Patient's anticipated LOS is less than 2 midnights, meeting these requirements: - Lives within 1 hour of care - Has a competent adult at home to recover with post-op recover - NO history of  - Chronic pain requiring opiods  - Diabetes  - Coronary Artery Disease  - Heart failure  - Heart attack  - Stroke  - DVT/VTE  - Cardiac arrhythmia  - Respiratory Failure/COPD  - Renal failure  - Anemia  - Advanced Liver disease      Donia Ast 06/04/2018, 9:16 AM

## 2018-06-04 NOTE — Anesthesia Postprocedure Evaluation (Signed)
Anesthesia Post Note  Patient: Tamara Barber  Procedure(s) Performed: TOTAL KNEE ARTHROPLASTY (Left Knee)     Patient location during evaluation: PACU Anesthesia Type: Spinal and Regional Level of consciousness: oriented and awake and alert Pain management: pain level controlled Vital Signs Assessment: post-procedure vital signs reviewed and stable Respiratory status: spontaneous breathing, respiratory function stable and patient connected to nasal cannula oxygen Cardiovascular status: blood pressure returned to baseline and stable Postop Assessment: no headache, no backache, no apparent nausea or vomiting, patient able to bend at knees and spinal receding Anesthetic complications: no    Last Vitals:  Vitals:   06/04/18 1417 06/04/18 1445  BP: 113/65 133/68  Pulse: (!) 55 (!) 55  Resp: (!) 9 12  Temp:    SpO2: 99% 97%    Last Pain:  Vitals:   06/04/18 1449  TempSrc:   PainSc: 4                  Keshayla Schrum,W. EDMOND

## 2018-06-04 NOTE — Anesthesia Procedure Notes (Signed)
Anesthesia Regional Block: Adductor canal block   Pre-Anesthetic Checklist: ,, timeout performed, Correct Patient, Correct Site, Correct Laterality, Correct Procedure, Correct Position, site marked, Risks and benefits discussed, pre-op evaluation,  At surgeon's request and post-op pain management  Laterality: Left  Prep: Maximum Sterile Barrier Precautions used, chloraprep       Needles:  Injection technique: Single-shot  Needle Type: Echogenic Stimulator Needle     Needle Length: 9cm  Needle Gauge: 21     Additional Needles:   Procedures:,,,, ultrasound used (permanent image in chart),,,,  Narrative:  Start time: 06/04/2018 9:41 AM End time: 06/04/2018 9:51 AM Injection made incrementally with aspirations every 5 mL.  Performed by: Personally  Anesthesiologist: Roderic Palau, MD  Additional Notes: 2% Lidocaine skin wheel.

## 2018-06-05 ENCOUNTER — Encounter (HOSPITAL_COMMUNITY): Payer: Self-pay | Admitting: Orthopedic Surgery

## 2018-06-05 DIAGNOSIS — Z79899 Other long term (current) drug therapy: Secondary | ICD-10-CM | POA: Diagnosis not present

## 2018-06-05 DIAGNOSIS — F329 Major depressive disorder, single episode, unspecified: Secondary | ICD-10-CM | POA: Diagnosis not present

## 2018-06-05 DIAGNOSIS — I10 Essential (primary) hypertension: Secondary | ICD-10-CM | POA: Diagnosis not present

## 2018-06-05 DIAGNOSIS — M1712 Unilateral primary osteoarthritis, left knee: Secondary | ICD-10-CM | POA: Diagnosis not present

## 2018-06-05 LAB — BASIC METABOLIC PANEL
Anion gap: 7 (ref 5–15)
BUN: 15 mg/dL (ref 8–23)
CALCIUM: 8.8 mg/dL — AB (ref 8.9–10.3)
CO2: 28 mmol/L (ref 22–32)
CREATININE: 0.76 mg/dL (ref 0.44–1.00)
Chloride: 105 mmol/L (ref 98–111)
Glucose, Bld: 133 mg/dL — ABNORMAL HIGH (ref 70–99)
Potassium: 4 mmol/L (ref 3.5–5.1)
Sodium: 140 mmol/L (ref 135–145)

## 2018-06-05 LAB — CBC
HCT: 38.1 % (ref 36.0–46.0)
Hemoglobin: 11.9 g/dL — ABNORMAL LOW (ref 12.0–15.0)
MCH: 30.4 pg (ref 26.0–34.0)
MCHC: 31.2 g/dL (ref 30.0–36.0)
MCV: 97.2 fL (ref 78.0–100.0)
PLATELETS: 228 10*3/uL (ref 150–400)
RBC: 3.92 MIL/uL (ref 3.87–5.11)
RDW: 11.7 % (ref 11.5–15.5)
WBC: 10.4 10*3/uL (ref 4.0–10.5)

## 2018-06-05 MED ORDER — OXYCODONE HCL 5 MG PO TABS: 5.0000 mg | ORAL_TABLET | Freq: Four times a day (QID) | ORAL | 0 refills | Status: DC | PRN

## 2018-06-05 MED ORDER — ASPIRIN 325 MG PO TBEC: 325.0000 mg | DELAYED_RELEASE_TABLET | Freq: Two times a day (BID) | ORAL | 0 refills | Status: DC

## 2018-06-05 MED ORDER — METHOCARBAMOL 500 MG PO TABS: 500.0000 mg | ORAL_TABLET | Freq: Four times a day (QID) | ORAL | 0 refills | Status: AC | PRN

## 2018-06-05 MED ORDER — ACETAMINOPHEN 500 MG PO TABS: 1000.0000 mg | ORAL_TABLET | Freq: Four times a day (QID) | ORAL | 0 refills | Status: DC

## 2018-06-05 NOTE — Progress Notes (Signed)
Physical Therapy Treatment Patient Details Name: Tamara Barber MRN: [id number] DOB: [birth year] Today's Date: 06/05/2018    History of Present Illness Pt is a 67 y/o female s/p elective L TKA. PMH includes HTN and depression.     PT Comments    Pt is POD #1 and is limited by significant reports of L knee pain.  Pt tearful during gait, but was able to make it to the hallway with min assist.  HEP review initiated, and pt left in recliner in bone foam with ice applied.     Follow Up Recommendations  Follow surgeon's recommendation for DC plan and follow-up therapies;Supervision for mobility/OOB     Equipment Recommendations  None recommended by PT    Recommendations for Other Services   NA     Precautions / Restrictions Precautions Precautions: Knee Precaution Booklet Issued: Yes (comment) Precaution Comments: knee exercise handout reviewed as well as no pillow under surgical knee Restrictions Weight Bearing Restrictions: Yes LLE Weight Bearing: Weight bearing as tolerated    Mobility  Bed Mobility               General bed mobility comments: Pt was OOB in the recliner chair.   Transfers Overall transfer level: Needs assistance Equipment used: Rolling walker (2 wheeled) Transfers: Sit to/from Stand Sit to Stand: Min assist         General transfer comment: Min assist to steady trunk during transitions, verbal cues for safe RW use and hand placement.   Ambulation/Gait Ambulation/Gait assistance: Min assist Gait Distance (Feet): 25 Feet Assistive device: Rolling walker (2 wheeled) Gait Pattern/deviations: Step-to pattern;Antalgic     General Gait Details: Pt near tears during gait due to pain despite pre medication.  Moderately antalgic gait pattern with short, slow steps          Balance Overall balance assessment: Needs assistance Sitting-balance support: Feet supported;No upper extremity supported Sitting balance-Leahy Scale: Good     Standing  balance support: Bilateral upper extremity supported Standing balance-Leahy Scale: Poor                              Cognition Arousal/Alertness: Awake/alert Behavior During Therapy: WFL for tasks assessed/performed Overall Cognitive Status: Within Functional Limits for tasks assessed                                        Exercises Total Joint Exercises Ankle Circles/Pumps: AROM;Both;20 reps Quad Sets: AROM;Left;10 reps Towel Squeeze: AROM;Both;10 reps Heel Slides: AAROM;Left;10 reps Goniometric ROM: 12-70    General Comments        Pertinent Vitals/Pain Pain Assessment: 0-10 Pain Score: 8  Pain Location: L knee  Pain Descriptors / Indicators: Aching;Operative site guarding Pain Intervention(s): Limited activity within patient's tolerance;Monitored during session;Repositioned;Premedicated before session;Ice applied           PT Goals (current goals can now be found in the care plan section) Acute Rehab PT Goals Patient Stated Goal: to go home  Progress towards PT goals: Progressing toward goals    Frequency    7X/week      PT Plan Current plan remains appropriate       AM-PAC PT "6 Clicks" Daily Activity  Outcome Measure  Difficulty turning over in bed (including adjusting bedclothes, sheets and blankets)?: A Little Difficulty moving from lying on back to sitting on  the side of the bed? : Unable Difficulty sitting down on and standing up from a chair with arms (e.g., wheelchair, bedside commode, etc,.)?: Unable Help needed moving to and from a bed to chair (including a wheelchair)?: A Little Help needed walking in hospital room?: A Little Help needed climbing 3-5 steps with a railing? : A Lot 6 Click Score: 13    End of Session   Activity Tolerance: Patient limited by pain Patient left: in chair;with call bell/phone within reach;with family/visitor present   PT Visit Diagnosis: Unsteadiness on feet (R26.81);Other  abnormalities of gait and mobility (R26.89);Pain Pain - Right/Left: Left Pain - part of body: Knee     Time: 5102-5852 PT Time Calculation (min) (ACUTE ONLY): 32 min  Charges:  $Gait Training: 8-22 mins $Therapeutic Exercise: 8-22 mins          06/05/2018, 2:56 PM   Clarene Curran B. [place], [place], DPT [phone]

## 2018-06-05 NOTE — Progress Notes (Signed)
06/05/2018 Pt is POD #1 and is moving much better during our PM session.  She was able to complete stair training with min assist overall, simulating home stairs, and gait with min guard assist, further down the hallway.  Pt was left in CPM in bed at the end of the session.  She has not urinated since before 9 am and has tried multiple times. RN made aware.  Barbarann Ehlers Jood Retana, PT, [place] #[phone]       06/05/18 1457  PT Visit Information  Last PT Received On 06/05/18  Assistance Needed +1  History of Present Illness Pt is a 67 y/o female s/p elective L TKA. PMH includes HTN and depression.   Subjective Data  Patient Stated Goal to go home   Precautions  Precautions Knee  Precaution Booklet Issued Yes (comment)  Precaution Comments knee exercise handout reviewed as well as no pillow under surgical knee  Restrictions  LLE Weight Bearing WBAT  Pain Assessment  Pain Assessment 0-10  Pain Score 6  Pain Location L knee   Pain Descriptors / Indicators Aching;Operative site guarding  Pain Intervention(s) Limited activity within patient's tolerance;Monitored during session;Repositioned  Cognition  Arousal/Alertness Awake/alert  Behavior During Therapy WFL for tasks assessed/performed  Overall Cognitive Status Within Functional Limits for tasks assessed  Bed Mobility  Overal bed mobility Needs Assistance  Sit to supine Min assist  General bed mobility comments Min assist to help pt lift her left leg while transitioning back into the bed.   Transfers  Overall transfer level Needs assistance  Equipment used Rolling walker (2 wheeled)  Transfers Sit to/from Stand  Sit to Stand Min guard  General transfer comment Min guard assist for safety during transitions.   Ambulation/Gait  Ambulation/Gait assistance Min guard  Gait Distance (Feet) 75 Feet  Assistive device Rolling walker (2 wheeled)  Gait Pattern/deviations Step-to pattern;Antalgic  General Gait Details Pt continues to have  moderately antalgic gait pattern, reporting burning in her left knee during WB, but was able to walk further this PM than in AM session.  Min guard assist for safety and balance.   Stairs Yes  Stairs assistance Min assist  Stair Management No rails;Step to pattern;Forwards;Two rails;With walker  Number of Stairs 6  General stair comments Practiced stairs forward with RW (on low side) as pt's first steps are brick height and deeper.  Min assist to steady pt and stabilize RW.  Min guard assist for stairs simulating second set of stairs for home entry with bil rails. Verbal cues for safe LE sequencing and min assist for balance and stability while preforming stairs.   Balance  Overall balance assessment Needs assistance  Sitting-balance support Feet supported;No upper extremity supported  Sitting balance-Leahy Scale Good  Standing balance support Bilateral upper extremity supported  Standing balance-Leahy Scale Poor  PT - End of Session  Activity Tolerance Patient limited by pain  Patient left with call bell/phone within reach;with family/visitor present;in bed;in CPM  Nurse Communication Other (comment) (pt still has not urinated since foley out this AM)   PT - Assessment/Plan  PT Plan Current plan remains appropriate  PT Visit Diagnosis Unsteadiness on feet (R26.81);Other abnormalities of gait and mobility (R26.89);Pain  Pain - Right/Left Left  Pain - part of body Knee  PT Frequency (ACUTE ONLY) 7X/week  Follow Up Recommendations Follow surgeon's recommendation for DC plan and follow-up therapies;Supervision for mobility/OOB  PT equipment None recommended by PT  AM-PAC PT "6 Clicks" Daily Activity Outcome Measure  Difficulty  turning over in bed (including adjusting bedclothes, sheets and blankets)? 3  Difficulty moving from lying on back to sitting on the side of the bed?  1  Difficulty sitting down on and standing up from a chair with arms (e.g., wheelchair, bedside commode, etc,.)? 1   Help needed moving to and from a bed to chair (including a wheelchair)? 3  Help needed walking in hospital room? 3  Help needed climbing 3-5 steps with a railing?  2  6 Click Score 13  Mobility G Code  CK  PT Goal Progression  Progress towards PT goals Progressing toward goals  PT Time Calculation  PT Start Time (ACUTE ONLY) 1429  PT Stop Time (ACUTE ONLY) 1451  PT Time Calculation (min) (ACUTE ONLY) 22 min  PT General Charges  $$ ACUTE PT VISIT 1 Visit  PT Treatments  $Gait Training 8-22 mins

## 2018-06-05 NOTE — Evaluation (Signed)
Occupational Therapy Evaluation Patient Details Name: Tamara Barber MRN: [id number] DOB: 06-24-1951 Today's Date: 06/05/2018    History of Present Illness Pt is a 67 y/o female s/p elective L TKA. PMH includes HTN and depression.    Clinical Impression   Pt admitted with the above diagnoses and presents with below problem list. Pt will benefit from continued acute OT to address the below listed deficits and maximize independence with basic ADLs prior to d/c home. PTA pt was independent with ADLs. Pt is currently min to mod A with LB ADLs and functional transfers/mobility. Pt very willing to work with therapy and do all tasks presented but struggled with pain throughout session (9/10 at end of session). Extra time and effort for tasks 2/2 pain. Nursing notified of pain level.        Follow Up Recommendations  Follow surgeon's recommendation for DC plan and follow-up therapies    Equipment Recommendations  None recommended by OT    Recommendations for Other Services       Precautions / Restrictions Precautions Precautions: Knee Precaution Comments: reviewed precautions Restrictions Weight Bearing Restrictions: Yes LLE Weight Bearing: Weight bearing as tolerated      Mobility Bed Mobility Overal bed mobility: Needs Assistance Bed Mobility: Supine to Sit     Supine to sit: Min assist     General bed mobility comments: Min A for LLE assist throughout bed mobility.   Transfers Overall transfer level: Needs assistance Equipment used: Rolling walker (2 wheeled) Transfers: Sit to/from Stand Sit to Stand: Min assist;From elevated surface         General transfer comment: Min A for lift assist and steadying from elevated bed height. Verbal cues for hand placement.     Balance Overall balance assessment: Needs assistance Sitting-balance support: No upper extremity supported;Feet supported Sitting balance-Leahy Scale: Fair     Standing balance support: Bilateral upper  extremity supported;During functional activity Standing balance-Leahy Scale: Poor Standing balance comment: Reliant on BUE support                            ADL either performed or assessed with clinical judgement   ADL Overall ADL's : Needs assistance/impaired Eating/Feeding: Set up;Sitting   Grooming: Set up;Sitting   Upper Body Bathing: Set up;Sitting   Lower Body Bathing: Moderate assistance;Sit to/from stand   Upper Body Dressing : Set up;Sitting   Lower Body Dressing: Moderate assistance;Sit to/from stand   Toilet Transfer: Minimal assistance;Ambulation;BSC;RW   Toileting- Clothing Manipulation and Hygiene: Moderate assistance;Sit to/from stand   Tub/ Shower Transfer: Minimal assistance;Tub transfer;Ambulation;Tub bench;Rolling walker   Functional mobility during ADLs: Minimal assistance;Min guard;Rolling walker General ADL Comments: Pt completed functional mobility to/from bathroom and toilet transfer.      Vision         Perception     Praxis      Pertinent Vitals/Pain Pain Assessment: 0-10 Pain Score: 9  Pain Location: L knee  Pain Descriptors / Indicators: Aching;Operative site guarding Pain Intervention(s): Limited activity within patient's tolerance;Monitored during session;Repositioned;Patient requesting pain meds-RN notified;Ice applied     Hand Dominance     Extremity/Trunk Assessment Upper Extremity Assessment Upper Extremity Assessment: Overall WFL for tasks assessed   Lower Extremity Assessment Lower Extremity Assessment: Defer to PT evaluation   Cervical / Trunk Assessment Cervical / Trunk Assessment: Normal   Communication Communication Communication: No difficulties   Cognition Arousal/Alertness: Awake/alert Behavior During Therapy: WFL for tasks assessed/performed Overall  Cognitive Status: Within Functional Limits for tasks assessed                                     General Comments        Exercises     Shoulder Instructions      Home Living Family/patient expects to be discharged to:: Private residence Living Arrangements: Spouse/significant other Available Help at Discharge: Family;Available 24 hours/day Type of Home: House Home Access: Stairs to enter;Level entry Entrance Stairs-Number of Steps: 15(7, then 8) Entrance Stairs-Rails: None(none on the first set, bilat rails on the second set ) Home Layout: One level     Bathroom Shower/Tub: Teacher, early years/pre: Handicapped height     Home Equipment: Environmental consultant - 2 wheels;Bedside commode;Tub bench          Prior Functioning/Environment Level of Independence: Independent                 OT Problem List: Impaired balance (sitting and/or standing);Decreased knowledge of use of DME or AE;Decreased knowledge of precautions;Pain      OT Treatment/Interventions: Self-care/ADL training;DME and/or AE instruction;Therapeutic activities;Patient/family education;Balance training    OT Goals(Current goals can be found in the care plan section) Acute Rehab OT Goals Patient Stated Goal: to go home  OT Goal Formulation: With patient Time For Goal Achievement: 06/12/18 Potential to Achieve Goals: Good ADL Goals Pt Will Perform Lower Body Bathing: with modified independence;with adaptive equipment;sit to/from stand Pt Will Perform Lower Body Dressing: with modified independence;sit to/from stand;with adaptive equipment Pt Will Transfer to Toilet: with modified independence;ambulating Pt Will Perform Toileting - Clothing Manipulation and hygiene: with modified independence;sit to/from stand Pt Will Perform Tub/Shower Transfer: with supervision;ambulating;3 in 1;rolling walker  OT Frequency: Min 2X/week   Barriers to D/C:            Co-evaluation              AM-PAC PT "6 Clicks" Daily Activity     Outcome Measure Help from another person eating meals?: None Help from another person taking  care of personal grooming?: A Little Help from another person toileting, which includes using toliet, bedpan, or urinal?: A Little Help from another person bathing (including washing, rinsing, drying)?: A Little Help from another person to put on and taking off regular upper body clothing?: None Help from another person to put on and taking off regular lower body clothing?: A Little 6 Click Score: 20   End of Session Equipment Utilized During Treatment: Rolling walker CPM Left Knee CPM Left Knee: On Nurse Communication: Patient requests pain meds;Mobility status  Activity Tolerance: Patient limited by pain Patient left: in chair;with call bell/phone within reach  OT Visit Diagnosis: Unsteadiness on feet (R26.81);Pain                Time: 3762-8315 OT Time Calculation (min): 36 min Charges:  OT General Charges $OT Visit: 1 Visit OT Evaluation $OT Eval Low Complexity: 1 Low OT Treatments $Self Care/Home Management : 8-22 mins G-Codes:       Hortencia Pilar 06/05/2018, 12:57 PM

## 2018-06-05 NOTE — Discharge Summary (Signed)
SPORTS MEDICINE & JOINT REPLACEMENT   Lara Mulch, MD   Carlyon Shadow, PA-C [address], [place], [place]  [address]                             [phone]  PATIENT ID: Tamara Barber        MRN:  [id number]          DOB/AGE: [birth year] / 67 y.o.    DISCHARGE SUMMARY  ADMISSION DATE:    06/04/2018 DISCHARGE DATE:   06/05/2018   ADMISSION DIAGNOSIS: primary osteoarthritis left knee    DISCHARGE DIAGNOSIS:  primary osteoarthritis left knee    ADDITIONAL DIAGNOSIS: Active Problems:   S/P total knee replacement  Past Medical History:  Diagnosis Date  . Depression   . Hypertension   . PONV (postoperative nausea and vomiting)     PROCEDURE: Procedure(s): TOTAL KNEE ARTHROPLASTY on 06/04/2018  CONSULTS:    HISTORY:  See H&P in chart  HOSPITAL COURSE:  Tamara Barber is a 67 y.o. admitted on 06/04/2018 and found to have a diagnosis of primary osteoarthritis left knee.  After appropriate laboratory studies were obtained  they were taken to the operating room on 06/04/2018 and underwent Procedure(s): TOTAL KNEE ARTHROPLASTY.   They were given perioperative antibiotics:  Anti-infectives (From admission, onward)   Start     Dose/Rate Route Frequency Ordered Stop   06/04/18 1800  ceFAZolin (ANCEF) IVPB 2g/100 mL premix     2 g 200 mL/hr over 30 Minutes Intravenous Every 6 hours 06/04/18 1431 06/05/18 0049   06/04/18 0800  ceFAZolin (ANCEF) IVPB 2g/100 mL premix     2 g 200 mL/hr over 30 Minutes Intravenous On call to O.R. 06/04/18 0630 06/04/18 1108    .  Patient given tranexamic acid IV or topical and exparel intra-operatively.  Tolerated the procedure well.    POD# 1: Vital signs were stable.  Patient denied Chest pain, shortness of breath, or calf pain.  Patient was started on Lovenox 30 mg subcutaneously twice daily at 8am.  Consults to PT, OT, and care management were made.  The patient was weight bearing as tolerated.  CPM was placed on the operative leg  0-90 degrees for 6-8 hours a day. When out of the CPM, patient was placed in the foam block to achieve full extension. Incentive spirometry was taught.  Dressing was changed.       POD #2, Continued  PT for ambulation and exercise program.  IV saline locked.  O2 discontinued.    The remainder of the hospital course was dedicated to ambulation and strengthening.   The patient was discharged on 1 Day Post-Op in  Good condition.  Blood products given:none  DIAGNOSTIC STUDIES: Recent vital signs:  Patient Vitals for the past 24 hrs:  BP Temp Temp src Pulse Resp SpO2  06/05/18 0955 128/67 - - (!) 50 - -  06/05/18 0501 128/67 97.8 F (36.6 C) Oral (!) 50 16 96 %  06/05/18 0030 125/62 97.8 F (36.6 C) Oral (!) 55 16 95 %  06/04/18 2031 120/63 98.1 F (36.7 C) Oral (!) 51 16 96 %  06/04/18 1719 128/76 98 F (36.7 C) Oral (!) 55 16 92 %  06/04/18 1637 135/63 97.7 F (36.5 C) - (!) 50 13 97 %  06/04/18 1617 131/66 - - - 14 95 %  06/04/18 1547 (!) 120/94 - - (!) 52 12 97 %  06/04/18 1517 131/68 - - (!) 51 12 95 %  06/04/18 1446 - - - (!) 55 17 92 %  06/04/18 1445 133/68 - - (!) 55 12 97 %  06/04/18 1417 113/65 - - (!) 55 (!) 9 99 %  06/04/18 1347 132/62 - - (!) 49 12 97 %  06/04/18 1331 128/67 - - (!) 50 10 100 %  06/04/18 1330 - - - (!) 50 (!) 9 98 %  06/04/18 1317 124/67 - - (!) 51 12 93 %       Recent laboratory studies: Recent Labs    06/05/18 0635  WBC 10.4  HGB 11.9*  HCT 38.1  PLT 228   Recent Labs    06/05/18 0635  NA 140  K 4.0  CL 105  CO2 28  BUN 15  CREATININE 0.76  GLUCOSE 133*  CALCIUM 8.8*   No results found for: INR, PROTIME   Recent Radiographic Studies :  No results found.  DISCHARGE INSTRUCTIONS: Discharge Instructions    CPM   Complete by:  As directed    Continuous passive motion machine (CPM):      Use the CPM from 0 to 90 for 4-6 hours per day.      You may increase by 10 per day.  You may break it up into 2 or 3 sessions per day.       Use CPM for 2 weeks or until you are told to stop.   Call MD / Call 911   Complete by:  As directed    If you experience chest pain or shortness of breath, CALL 911 and be transported to the hospital emergency room.  If you develope a fever above 101 F, pus (white drainage) or increased drainage or redness at the wound, or calf pain, call your surgeon's office.   Constipation Prevention   Complete by:  As directed    Drink plenty of fluids.  Prune juice may be helpful.  You may use a stool softener, such as Colace (over the counter) 100 mg twice a day.  Use MiraLax (over the counter) for constipation as needed.   Diet - low sodium heart healthy   Complete by:  As directed    Discharge instructions   Complete by:  As directed    INSTRUCTIONS AFTER JOINT REPLACEMENT   Remove items at home which could result in a fall. This includes throw rugs or furniture in walking pathways ICE to the affected joint every three hours while awake for 30 minutes at a time, for at least the first 3-5 days, and then as needed for pain and swelling.  Continue to use ice for pain and swelling. You may notice swelling that will progress down to the foot and ankle.  This is normal after surgery.  Elevate your leg when you are not up walking on it.   Continue to use the breathing machine you got in the hospital (incentive spirometer) which will help keep your temperature down.  It is common for your temperature to cycle up and down following surgery, especially at night when you are not up moving around and exerting yourself.  The breathing machine keeps your lungs expanded and your temperature down.   DIET:  As you were doing prior to hospitalization, we recommend a well-balanced diet.  DRESSING / WOUND CARE / SHOWERING  Keep the surgical dressing until follow up.  The dressing is water proof, so you can shower without any extra covering.  IF THE DRESSING FALLS OFF or the wound gets wet inside, change the dressing with  sterile gauze.  Please use good hand washing techniques before changing the dressing.  Do not use any lotions or creams on the incision until instructed by your surgeon.    ACTIVITY  Increase activity slowly as tolerated, but follow the weight bearing instructions below.   No driving for 6 weeks or until further direction given by your physician.  You cannot drive while taking narcotics.  No lifting or carrying greater than 10 lbs. until further directed by your surgeon. Avoid periods of inactivity such as sitting longer than an hour when not asleep. This helps prevent blood clots.  You may return to work once you are authorized by your doctor.     WEIGHT BEARING   Weight bearing as tolerated with assist device (walker, cane, etc) as directed, use it as long as suggested by your surgeon or therapist, typically at least 4-6 weeks.   EXERCISES  Results after joint replacement surgery are often greatly improved when you follow the exercise, range of motion and muscle strengthening exercises prescribed by your doctor. Safety measures are also important to protect the joint from further injury. Any time any of these exercises cause you to have increased pain or swelling, decrease what you are doing until you are comfortable again and then slowly increase them. If you have problems or questions, call your caregiver or physical therapist for advice.   Rehabilitation is important following a joint replacement. After just a few days of immobilization, the muscles of the leg can become weakened and shrink (atrophy).  These exercises are designed to build up the tone and strength of the thigh and leg muscles and to improve motion. Often times heat used for twenty to thirty minutes before working out will loosen up your tissues and help with improving the range of motion but do not use heat for the first two weeks following surgery (sometimes heat can increase post-operative swelling).   These exercises  can be done on a training (exercise) mat, on the floor, on a table or on a bed. Use whatever works the best and is most comfortable for you.    Use music or television while you are exercising so that the exercises are a pleasant break in your day. This will make your life better with the exercises acting as a break in your routine that you can look forward to.   Perform all exercises about fifteen times, three times per day or as directed.  You should exercise both the operative leg and the other leg as well.   Exercises include:   Quad Sets - Tighten up the muscle on the front of the thigh (Quad) and hold for 5-10 seconds.   Straight Leg Raises - With your knee straight (if you were given a brace, keep it on), lift the leg to 60 degrees, hold for 3 seconds, and slowly lower the leg.  Perform this exercise against resistance later as your leg gets stronger.  Leg Slides: Lying on your back, slowly slide your foot toward your buttocks, bending your knee up off the floor (only go as far as is comfortable). Then slowly slide your foot back down until your leg is flat on the floor again.  Angel Wings: Lying on your back spread your legs to the side as far apart as you can without causing discomfort.  Hamstring Strength:  Lying on your back, push your heel against the  floor with your leg straight by tightening up the muscles of your buttocks.  Repeat, but this time bend your knee to a comfortable angle, and push your heel against the floor.  You may put a pillow under the heel to make it more comfortable if necessary.   A rehabilitation program following joint replacement surgery can speed recovery and prevent re-injury in the future due to weakened muscles. Contact your doctor or a physical therapist for more information on knee rehabilitation.    CONSTIPATION  Constipation is defined medically as fewer than three stools per week and severe constipation as less than one stool per week.  Even if you have a  regular bowel pattern at home, your normal regimen is likely to be disrupted due to multiple reasons following surgery.  Combination of anesthesia, postoperative narcotics, change in appetite and fluid intake all can affect your bowels.   YOU MUST use at least one of the following options; they are listed in order of increasing strength to get the job done.  They are all available over the counter, and you may need to use some, POSSIBLY even all of these options:    Drink plenty of fluids (prune juice may be helpful) and high fiber foods Colace 100 mg by mouth twice a day  Senokot for constipation as directed and as needed Dulcolax (bisacodyl), take with full glass of water  Miralax (polyethylene glycol) once or twice a day as needed.  If you have tried all these things and are unable to have a bowel movement in the first 3-4 days after surgery call either your surgeon or your primary doctor.    If you experience loose stools or diarrhea, hold the medications until you stool forms back up.  If your symptoms do not get better within 1 week or if they get worse, check with your doctor.  If you experience "the worst abdominal pain ever" or develop nausea or vomiting, please contact the office immediately for further recommendations for treatment.   ITCHING:  If you experience itching with your medications, try taking only a single pain pill, or even half a pain pill at a time.  You can also use Benadryl over the counter for itching or also to help with sleep.   TED HOSE STOCKINGS:  Use stockings on both legs until for at least 2 weeks or as directed by physician office. They may be removed at night for sleeping.  MEDICATIONS:  See your medication summary on the "After Visit Summary" that nursing will review with you.  You may have some home medications which will be placed on hold until you complete the course of blood thinner medication.  It is important for you to complete the blood thinner  medication as prescribed.  PRECAUTIONS:  If you experience chest pain or shortness of breath - call 911 immediately for transfer to the hospital emergency department.   If you develop a fever greater that 101 F, purulent drainage from wound, increased redness or drainage from wound, foul odor from the wound/dressing, or calf pain - CONTACT YOUR SURGEON.                                                   FOLLOW-UP APPOINTMENTS:  If you do not already have a post-op appointment, please call the office for an appointment to  be seen by your surgeon.  Guidelines for how soon to be seen are listed in your "After Visit Summary", but are typically between 1-4 weeks after surgery.  OTHER INSTRUCTIONS:   Knee Replacement:  Do not place pillow under knee, focus on keeping the knee straight while resting. CPM instructions: 0-90 degrees, 2 hours in the morning, 2 hours in the afternoon, and 2 hours in the evening. Place foam block, curve side up under heel at all times except when in CPM or when walking.  DO NOT modify, tear, cut, or change the foam block in any way.  MAKE SURE YOU:  Understand these instructions.  Get help right away if you are not doing well or get worse.    Thank you for letting us be a part of your medical care team.  It is a privilege we respect greatly.  We hope these instructions will help you stay on track for a fast and full recovery!   Increase activity slowly as tolerated   Complete by:  As directed       DISCHARGE MEDICATIONS:   Allergies as of 06/05/2018   No Known Allergies     Medication List    STOP taking these medications   ibuprofen 200 MG tablet Commonly known as:  ADVIL,MOTRIN     TAKE these medications   acetaminophen 500 MG tablet Commonly known as:  TYLENOL Take 2 tablets (1,000 mg total) by mouth every 6 (six) hours.   aspirin 325 MG EC tablet Take 1 tablet (325 mg total) by mouth 2 (two) times daily.   atenolol 50 MG tablet Commonly known as:   TENORMIN Take 50 mg by mouth daily.   atorvastatin 10 MG tablet Commonly known as:  LIPITOR Take 10 mg by mouth daily.   cetirizine 10 MG tablet Commonly known as:  ZYRTEC Take 10 mg by mouth daily.   CLEAR EYES REDNESS RELIEF 0.012-0.2 % Soln Generic drug:  naphazoline-glycerin Place 1 drop into both eyes 4 (four) times daily as needed (for redness relief).   methocarbamol 500 MG tablet Commonly known as:  ROBAXIN Take 1-2 tablets (500-1,000 mg total) by mouth every 6 (six) hours as needed for muscle spasms.   oxyCODONE 5 MG immediate release tablet Commonly known as:  Oxy IR/ROXICODONE Take 1-2 tablets (5-10 mg total) by mouth every 6 (six) hours as needed for moderate pain (pain score 4-6).   sertraline 100 MG tablet Commonly known as:  ZOLOFT Take 100 mg by mouth daily.            Durable Medical Equipment  (From admission, onward)        Start     Ordered   06/04/18 1640  DME Walker rolling  Once    Question:  Patient needs a walker to treat with the following condition  Answer:  S/P total knee replacement   06/04/18 1639   06/04/18 1640  DME 3 n 1  Once     06/04/18 1639   06/04/18 1640  DME Bedside commode  Once    Question:  Patient needs a bedside commode to treat with the following condition  Answer:  S/P total knee replacement   06/04/18 1639      FOLLOW UP VISIT:    DISPOSITION: HOME VS. SNF  CONDITION:  Good   Donia Ast 06/05/2018, 1:06 PM

## 2018-06-05 NOTE — Op Note (Signed)
TOTAL KNEE REPLACEMENT OPERATIVE NOTE:  06/04/2018  3:24 PM  PATIENT:  Tamara Barber  67 y.o. female  PRE-OPERATIVE DIAGNOSIS:  primary osteoarthritis left knee  POST-OPERATIVE DIAGNOSIS:  Primary Osteoarthritis Left knee  PROCEDURE:  Procedure(s): TOTAL KNEE ARTHROPLASTY  SURGEON:  Surgeon(s): Vickey Huger, MD  PHYSICIAN ASSISTANT:  Nehemiah Massed, [facility]  ANESTHESIA:   spinal  DRAINS: Hemovac  SPECIMEN: None  COUNTS:  Correct  TOURNIQUET:   Total Tourniquet Time Documented: Thigh (Left) - 39 minutes Total: Thigh (Left) - 39 minutes   DICTATION:  Indication for procedure:    The patient is a 67 y.o. female who has failed conservative treatment for primary osteoarthritis left knee.  Informed consent was obtained prior to anesthesia. The risks versus benefits of the operation were explain and in a way the patient can, and did, understand.   On the implant demand matching protocol, this patient scored 10.  Therefore, this patient was not receive a polyethylene insert with vitamin E which is a high demand implant.  Description of procedure:     The patient was taken to the operating room and placed under anesthesia.  The patient was positioned in the usual fashion taking care that all body parts were adequately padded and/or protected.  I foley catheter was not placed.  A tourniquet was applied and the leg prepped and draped in the usual sterile fashion.  The extremity was exsanguinated with the esmarch and tourniquet inflated to 350 mmHg.  Pre-operative range of motion was normal.  The knee was in 8 degree of mild valgus.  A midline incision approximately 6-7 inches long was made with a #10 blade.  A new blade was used to make a parapatellar arthrotomy going 2-3 cm into the quadriceps tendon, over the patella, and alongside the medial aspect of the patellar tendon.  A synovectomy was then performed with the #10 blade and forceps. I then elevated the deep MCL off the medial  tibial metaphysis subperiosteally around to the semimembranosus attachment.    I everted the patella and used calipers to measure patellar thickness.  I used the reamer to ream down to appropriate thickness to recreate the native thickness.  I then removed excess bone with the rongeur and sagittal saw.  I used the appropriately sized template and drilled the three lug holes.  I then put the trial in place and measured the thickness with the calipers to ensure recreation of the native thickness.  The trial was then removed and the patella subluxed and the knee brought into flexion.  A homan retractor was place to retract and protect the patella and lateral structures.  A Z-retractor was place medially to protect the medial structures.  The extra-medullary alignment system was used to make cut the tibial articular surface perpendicular to the anamotic axis of the tibia and in 3 degrees of posterior slope.  The cut surface and alignment jig was removed.  I then used the intramedullary alignment guide to make a 7 valgus cut on the distal femur.  I then marked out the epicondylar axis on the distal femur.  The posterior condylar axis measured 3 degrees.  I then used the anterior referencing sizer and measured the femur to be a size 7.  The 4-In-1 cutting block was screwed into place in external rotation matching the posterior condylar angle, making our cuts perpendicular to the epicondylar axis.  Anterior, posterior and chamfer cuts were made with the sagittal saw.  The cutting block and cut pieces  were removed.  A lamina spreader was placed in 90 degrees of flexion.  The ACL, PCL, menisci, and posterior condylar osteophytes were removed.  A 11 mm spacer blocked was found to offer good flexion and extension gap balance after moderate in degree releasing.   The scoop retractor was then placed and the femoral finishing block was pinned in place.  The small sagittal saw was used as well as the lug drill to finish  the femur.  The block and cut surfaces were removed and the medullary canal hole filled with autograft bone from the cut pieces.  The tibia was delivered forward in deep flexion and external rotation.  A size E tray was selected and pinned into place centered on the medial 1/3 of the tibial tubercle.  The reamer and keel was used to prepare the tibia through the tray.    I then trialed with the size 7 femur, size E tibia, a 11 mm insert and the 35 patella.  I had excellent flexion/extension gap balance, excellent patella tracking.  Flexion was full and beyond 120 degrees; extension was zero.  These components were chosen and the staff opened them to me on the back table while the knee was lavaged copiously and the cement mixed.  The soft tissue was infiltrated with 60cc of exparel 1.3% through a 21 gauge needle.  I cemented in the components and removed all excess cement.  The polyethylene tibial component was snapped into place and the knee placed in extension while cement was hardening.  The capsule was infilltrated with 30cc of .25% Marcaine with epinephrine.  A hemovac was place in the joint exiting superolaterally.  A pain pump was place superomedially superficial to the arthrotomy.  Once the cement was hard, the tourniquet was let down.  Hemostasis was obtained.  The arthrotomy was closed with figure-8 #1 vicryl sutures.  The deep soft tissues were closed with #0 vicryls and the subcuticular layer closed with a running #2-0 vicryl.  The skin was reapproximated and closed with skin staples.  The wound was dressed with xeroform, 4 x4's, 2 ABD sponges, a single layer of webril and a TED stocking.   The patient was then awakened, extubated, and taken to the recovery room in stable condition.  BLOOD LOSS:  300cc DRAINS: 1 hemovac, 1 pain catheter COMPLICATIONS:  None.  PLAN OF CARE: Admit for overnight observation  PATIENT DISPOSITION:  PACU - hemodynamically stable.   Delay start of  Pharmacological VTE agent (>24hrs) due to surgical blood loss or risk of bleeding:  not applicable  Please fax a copy of this op note to my office at [phone] (please only include page 1 and 2 of the Case Information op note)

## 2018-06-05 NOTE — Progress Notes (Signed)
SPORTS MEDICINE AND JOINT REPLACEMENT  Lara Mulch, MD    Carlyon Shadow, PA-C [address], [place], [place]  [address]                             [phone]   PROGRESS NOTE  Subjective:  negative for Chest Pain  negative for Shortness of Breath  negative for Nausea/Vomiting   negative for Calf Pain  negative for Bowel Movement   Tolerating Diet: yes         Patient reports pain as 3 on 0-10 scale.    Objective: Vital signs in last 24 hours:    Patient Vitals for the past 24 hrs:  BP Temp Temp src Pulse Resp SpO2  06/05/18 0501 128/67 97.8 F (36.6 C) Oral (!) 50 16 96 %  06/05/18 0030 125/62 97.8 F (36.6 C) Oral (!) 55 16 95 %  06/04/18 2031 120/63 98.1 F (36.7 C) Oral (!) 51 16 96 %  06/04/18 1719 128/76 98 F (36.7 C) Oral (!) 55 16 92 %  06/04/18 1637 135/63 97.7 F (36.5 C) - (!) 50 13 97 %  06/04/18 1617 131/66 - - - 14 95 %  06/04/18 1547 (!) 120/94 - - (!) 52 12 97 %  06/04/18 1517 131/68 - - (!) 51 12 95 %  06/04/18 1446 - - - (!) 55 17 92 %  06/04/18 1445 133/68 - - (!) 55 12 97 %  06/04/18 1417 113/65 - - (!) 55 (!) 9 99 %  06/04/18 1347 132/62 - - (!) 49 12 97 %  06/04/18 1331 128/67 - - (!) 50 10 100 %  06/04/18 1330 - - - (!) 50 (!) 9 98 %  06/04/18 1317 124/67 - - (!) 51 12 93 %  06/04/18 1303 129/64 (!) 97.2 F (36.2 C) - 64 13 94 %  06/04/18 0959 124/66 - - - - -  06/04/18 0945 (!) 150/66 - - (!) 56 11 98 %  06/04/18 0940 - - - (!) 57 12 91 %  06/04/18 0755 (!) 160/70 97.6 F (36.4 C) Oral 63 18 97 %    @flow {1959:LAST@   Intake/Output from previous day:   06/24 0701 - 06/25 0700 In: 3846 [I.V.:1700] Out: 1450 [Urine:1350]   Intake/Output this shift:   No intake/output data recorded.   Intake/Output      06/24 0701 - 06/25 0700 06/25 0701 - 06/26 0700   I.V. 1700    IV Piggyback 110    Total Intake 1810    Urine 1350    Blood 100    Total Output 1450    Net +360            LABORATORY DATA: No results for  input(s): WBC, HGB, HCT, PLT in the last 168 hours. No results for input(s): NA, K, CL, CO2, BUN, CREATININE, GLUCOSE, CALCIUM in the last 168 hours. No results found for: INR, PROTIME  Examination:  General appearance: alert, cooperative and no distress Extremities: extremities normal, atraumatic, no cyanosis or edema  Wound Exam: clean, dry, intact   Drainage:  None: wound tissue dry  Motor Exam: Quadriceps and Hamstrings Intact  Sensory Exam: Superficial Peroneal, Deep Peroneal and Tibial normal   Assessment:    1 Day Post-Op  Procedure(s) (LRB): TOTAL KNEE ARTHROPLASTY (Left)  ADDITIONAL DIAGNOSIS:  Active Problems:   S/P total knee replacement    Plan: Physical Therapy as ordered  Weight Bearing as Tolerated (WBAT)  DVT Prophylaxis:  Aspirin  DISCHARGE PLAN: Home  DISCHARGE NEEDS: HHPT   Patient doing well, expected D/C home today   Patient's anticipated LOS is less than 2 midnights, meeting these requirements: - Lives within 1 hour of care - Has a competent adult at home to recover with post-op recover - NO history of  - Chronic pain requiring opiods  - Diabetes  - Coronary Artery Disease  - Heart failure  - Heart attack  - Stroke  - DVT/VTE  - Cardiac arrhythmia  - Respiratory Failure/COPD  - Renal failure  - Anemia  - Advanced Liver disease              Donia Ast 06/05/2018, 7:13 AM

## 2018-06-05 NOTE — Care Management Note (Signed)
Case Management Note  Patient Details  Name: Tamara Barber MRN: [id number] Date of Birth: 07-Mar-1951  Subjective/Objective: 67 yr old female s/p left total knee arthroplasty.                   Action/Plan: Case manager spoke with patient and her husband concerning discharge plan and DME. Patient was preoperatively setup with [facility], no changes. DME has been delivered to the home. She will have family support at discharge.    Expected Discharge Date:  06/06/18             Expected Discharge Plan:  [place]  In-House Referral:  NA  Discharge planning Services  CM Consult  Post Acute Care Choice:  Durable Medical Equipment, Home Health Choice offered to:  Patient, Spouse  DME Arranged:  3-N-1, Walker rolling, CPM DME Agency:  [employer]  HH Arranged:  PT [place]:  [facility] (formerly [employer])  Status of Service:  Completed, signed off  If discussed at [employer], dates discussed:    Additional Comments:  Ninfa Meeker, RN 06/05/2018, 3:45 PM

## 2018-06-05 NOTE — Progress Notes (Signed)
Patient was unable to void after having her Foley removed this morning.  700 mls obtained. In and out done without difficulty per Farrell Ours, Nurse intern.

## 2018-06-06 DIAGNOSIS — Z79891 Long term (current) use of opiate analgesic: Secondary | ICD-10-CM | POA: Diagnosis not present

## 2018-06-06 DIAGNOSIS — F329 Major depressive disorder, single episode, unspecified: Secondary | ICD-10-CM | POA: Diagnosis not present

## 2018-06-06 DIAGNOSIS — Z471 Aftercare following joint replacement surgery: Secondary | ICD-10-CM | POA: Diagnosis not present

## 2018-06-06 DIAGNOSIS — I10 Essential (primary) hypertension: Secondary | ICD-10-CM | POA: Diagnosis not present

## 2018-06-06 DIAGNOSIS — Z7982 Long term (current) use of aspirin: Secondary | ICD-10-CM | POA: Diagnosis not present

## 2018-06-06 DIAGNOSIS — Z96652 Presence of left artificial knee joint: Secondary | ICD-10-CM | POA: Diagnosis not present

## 2018-06-07 DIAGNOSIS — I10 Essential (primary) hypertension: Secondary | ICD-10-CM | POA: Diagnosis not present

## 2018-06-07 DIAGNOSIS — Z471 Aftercare following joint replacement surgery: Secondary | ICD-10-CM | POA: Diagnosis not present

## 2018-06-07 DIAGNOSIS — Z79891 Long term (current) use of opiate analgesic: Secondary | ICD-10-CM | POA: Diagnosis not present

## 2018-06-07 DIAGNOSIS — Z7982 Long term (current) use of aspirin: Secondary | ICD-10-CM | POA: Diagnosis not present

## 2018-06-07 DIAGNOSIS — Z96652 Presence of left artificial knee joint: Secondary | ICD-10-CM | POA: Diagnosis not present

## 2018-06-07 DIAGNOSIS — F329 Major depressive disorder, single episode, unspecified: Secondary | ICD-10-CM | POA: Diagnosis not present

## 2018-06-08 DIAGNOSIS — I10 Essential (primary) hypertension: Secondary | ICD-10-CM | POA: Diagnosis not present

## 2018-06-08 DIAGNOSIS — Z96652 Presence of left artificial knee joint: Secondary | ICD-10-CM | POA: Diagnosis not present

## 2018-06-08 DIAGNOSIS — Z471 Aftercare following joint replacement surgery: Secondary | ICD-10-CM | POA: Diagnosis not present

## 2018-06-08 DIAGNOSIS — Z79891 Long term (current) use of opiate analgesic: Secondary | ICD-10-CM | POA: Diagnosis not present

## 2018-06-08 DIAGNOSIS — F329 Major depressive disorder, single episode, unspecified: Secondary | ICD-10-CM | POA: Diagnosis not present

## 2018-06-08 DIAGNOSIS — Z7982 Long term (current) use of aspirin: Secondary | ICD-10-CM | POA: Diagnosis not present

## 2018-06-11 DIAGNOSIS — Z96652 Presence of left artificial knee joint: Secondary | ICD-10-CM | POA: Diagnosis not present

## 2018-06-11 DIAGNOSIS — Z471 Aftercare following joint replacement surgery: Secondary | ICD-10-CM | POA: Diagnosis not present

## 2018-06-11 DIAGNOSIS — I10 Essential (primary) hypertension: Secondary | ICD-10-CM | POA: Diagnosis not present

## 2018-06-11 DIAGNOSIS — Z79891 Long term (current) use of opiate analgesic: Secondary | ICD-10-CM | POA: Diagnosis not present

## 2018-06-11 DIAGNOSIS — F329 Major depressive disorder, single episode, unspecified: Secondary | ICD-10-CM | POA: Diagnosis not present

## 2018-06-11 DIAGNOSIS — Z7982 Long term (current) use of aspirin: Secondary | ICD-10-CM | POA: Diagnosis not present

## 2018-06-12 DIAGNOSIS — Z471 Aftercare following joint replacement surgery: Secondary | ICD-10-CM | POA: Diagnosis not present

## 2018-06-12 DIAGNOSIS — I10 Essential (primary) hypertension: Secondary | ICD-10-CM | POA: Diagnosis not present

## 2018-06-12 DIAGNOSIS — Z79891 Long term (current) use of opiate analgesic: Secondary | ICD-10-CM | POA: Diagnosis not present

## 2018-06-12 DIAGNOSIS — F329 Major depressive disorder, single episode, unspecified: Secondary | ICD-10-CM | POA: Diagnosis not present

## 2018-06-12 DIAGNOSIS — Z7982 Long term (current) use of aspirin: Secondary | ICD-10-CM | POA: Diagnosis not present

## 2018-06-12 DIAGNOSIS — Z96652 Presence of left artificial knee joint: Secondary | ICD-10-CM | POA: Diagnosis not present

## 2018-06-13 DIAGNOSIS — M62552 Muscle wasting and atrophy, not elsewhere classified, left thigh: Secondary | ICD-10-CM | POA: Diagnosis not present

## 2018-06-13 DIAGNOSIS — M25462 Effusion, left knee: Secondary | ICD-10-CM | POA: Diagnosis not present

## 2018-06-13 DIAGNOSIS — M25562 Pain in left knee: Secondary | ICD-10-CM | POA: Diagnosis not present

## 2018-06-13 DIAGNOSIS — Z96652 Presence of left artificial knee joint: Secondary | ICD-10-CM | POA: Diagnosis not present

## 2018-06-13 DIAGNOSIS — R2689 Other abnormalities of gait and mobility: Secondary | ICD-10-CM | POA: Diagnosis not present

## 2018-06-15 DIAGNOSIS — Z96652 Presence of left artificial knee joint: Secondary | ICD-10-CM | POA: Diagnosis not present

## 2018-06-18 DIAGNOSIS — Z96652 Presence of left artificial knee joint: Secondary | ICD-10-CM | POA: Diagnosis not present

## 2018-06-18 DIAGNOSIS — M25462 Effusion, left knee: Secondary | ICD-10-CM | POA: Diagnosis not present

## 2018-06-18 DIAGNOSIS — M62552 Muscle wasting and atrophy, not elsewhere classified, left thigh: Secondary | ICD-10-CM | POA: Diagnosis not present

## 2018-06-18 DIAGNOSIS — M25562 Pain in left knee: Secondary | ICD-10-CM | POA: Diagnosis not present

## 2018-06-18 DIAGNOSIS — R2689 Other abnormalities of gait and mobility: Secondary | ICD-10-CM | POA: Diagnosis not present

## 2018-06-20 DIAGNOSIS — M62552 Muscle wasting and atrophy, not elsewhere classified, left thigh: Secondary | ICD-10-CM | POA: Diagnosis not present

## 2018-06-20 DIAGNOSIS — Z96652 Presence of left artificial knee joint: Secondary | ICD-10-CM | POA: Diagnosis not present

## 2018-06-20 DIAGNOSIS — M25462 Effusion, left knee: Secondary | ICD-10-CM | POA: Diagnosis not present

## 2018-06-20 DIAGNOSIS — M25562 Pain in left knee: Secondary | ICD-10-CM | POA: Diagnosis not present

## 2018-06-20 DIAGNOSIS — R2689 Other abnormalities of gait and mobility: Secondary | ICD-10-CM | POA: Diagnosis not present

## 2018-06-22 DIAGNOSIS — M62552 Muscle wasting and atrophy, not elsewhere classified, left thigh: Secondary | ICD-10-CM | POA: Diagnosis not present

## 2018-06-22 DIAGNOSIS — M25562 Pain in left knee: Secondary | ICD-10-CM | POA: Diagnosis not present

## 2018-06-22 DIAGNOSIS — M25462 Effusion, left knee: Secondary | ICD-10-CM | POA: Diagnosis not present

## 2018-06-22 DIAGNOSIS — Z96652 Presence of left artificial knee joint: Secondary | ICD-10-CM | POA: Diagnosis not present

## 2018-06-22 DIAGNOSIS — R2689 Other abnormalities of gait and mobility: Secondary | ICD-10-CM | POA: Diagnosis not present

## 2018-06-25 DIAGNOSIS — R2689 Other abnormalities of gait and mobility: Secondary | ICD-10-CM | POA: Diagnosis not present

## 2018-06-25 DIAGNOSIS — M62552 Muscle wasting and atrophy, not elsewhere classified, left thigh: Secondary | ICD-10-CM | POA: Diagnosis not present

## 2018-06-25 DIAGNOSIS — M25562 Pain in left knee: Secondary | ICD-10-CM | POA: Diagnosis not present

## 2018-06-25 DIAGNOSIS — Z96652 Presence of left artificial knee joint: Secondary | ICD-10-CM | POA: Diagnosis not present

## 2018-06-25 DIAGNOSIS — M25462 Effusion, left knee: Secondary | ICD-10-CM | POA: Diagnosis not present

## 2018-06-27 DIAGNOSIS — M25462 Effusion, left knee: Secondary | ICD-10-CM | POA: Diagnosis not present

## 2018-06-27 DIAGNOSIS — M62552 Muscle wasting and atrophy, not elsewhere classified, left thigh: Secondary | ICD-10-CM | POA: Diagnosis not present

## 2018-06-27 DIAGNOSIS — M25562 Pain in left knee: Secondary | ICD-10-CM | POA: Diagnosis not present

## 2018-06-27 DIAGNOSIS — R2689 Other abnormalities of gait and mobility: Secondary | ICD-10-CM | POA: Diagnosis not present

## 2018-06-27 DIAGNOSIS — Z96652 Presence of left artificial knee joint: Secondary | ICD-10-CM | POA: Diagnosis not present

## 2018-06-29 DIAGNOSIS — Z96652 Presence of left artificial knee joint: Secondary | ICD-10-CM | POA: Diagnosis not present

## 2018-06-29 DIAGNOSIS — M25562 Pain in left knee: Secondary | ICD-10-CM | POA: Diagnosis not present

## 2018-06-29 DIAGNOSIS — M25462 Effusion, left knee: Secondary | ICD-10-CM | POA: Diagnosis not present

## 2018-06-29 DIAGNOSIS — M62552 Muscle wasting and atrophy, not elsewhere classified, left thigh: Secondary | ICD-10-CM | POA: Diagnosis not present

## 2018-06-29 DIAGNOSIS — R2689 Other abnormalities of gait and mobility: Secondary | ICD-10-CM | POA: Diagnosis not present

## 2018-07-03 DIAGNOSIS — M25562 Pain in left knee: Secondary | ICD-10-CM | POA: Diagnosis not present

## 2018-07-03 DIAGNOSIS — R2689 Other abnormalities of gait and mobility: Secondary | ICD-10-CM | POA: Diagnosis not present

## 2018-07-03 DIAGNOSIS — Z96652 Presence of left artificial knee joint: Secondary | ICD-10-CM | POA: Diagnosis not present

## 2018-07-03 DIAGNOSIS — M62552 Muscle wasting and atrophy, not elsewhere classified, left thigh: Secondary | ICD-10-CM | POA: Diagnosis not present

## 2018-07-03 DIAGNOSIS — M25462 Effusion, left knee: Secondary | ICD-10-CM | POA: Diagnosis not present

## 2018-07-05 DIAGNOSIS — M62552 Muscle wasting and atrophy, not elsewhere classified, left thigh: Secondary | ICD-10-CM | POA: Diagnosis not present

## 2018-07-05 DIAGNOSIS — M25562 Pain in left knee: Secondary | ICD-10-CM | POA: Diagnosis not present

## 2018-07-05 DIAGNOSIS — M25462 Effusion, left knee: Secondary | ICD-10-CM | POA: Diagnosis not present

## 2018-07-05 DIAGNOSIS — R2689 Other abnormalities of gait and mobility: Secondary | ICD-10-CM | POA: Diagnosis not present

## 2018-07-05 DIAGNOSIS — Z96652 Presence of left artificial knee joint: Secondary | ICD-10-CM | POA: Diagnosis not present

## 2018-07-10 DIAGNOSIS — M25462 Effusion, left knee: Secondary | ICD-10-CM | POA: Diagnosis not present

## 2018-07-10 DIAGNOSIS — R2689 Other abnormalities of gait and mobility: Secondary | ICD-10-CM | POA: Diagnosis not present

## 2018-07-10 DIAGNOSIS — Z96652 Presence of left artificial knee joint: Secondary | ICD-10-CM | POA: Diagnosis not present

## 2018-07-10 DIAGNOSIS — M25562 Pain in left knee: Secondary | ICD-10-CM | POA: Diagnosis not present

## 2018-07-10 DIAGNOSIS — M62552 Muscle wasting and atrophy, not elsewhere classified, left thigh: Secondary | ICD-10-CM | POA: Diagnosis not present

## 2018-07-12 DIAGNOSIS — M62552 Muscle wasting and atrophy, not elsewhere classified, left thigh: Secondary | ICD-10-CM | POA: Diagnosis not present

## 2018-07-12 DIAGNOSIS — Z96652 Presence of left artificial knee joint: Secondary | ICD-10-CM | POA: Diagnosis not present

## 2018-07-12 DIAGNOSIS — M25562 Pain in left knee: Secondary | ICD-10-CM | POA: Diagnosis not present

## 2018-07-12 DIAGNOSIS — R2689 Other abnormalities of gait and mobility: Secondary | ICD-10-CM | POA: Diagnosis not present

## 2018-07-12 DIAGNOSIS — M25462 Effusion, left knee: Secondary | ICD-10-CM | POA: Diagnosis not present

## 2018-07-16 DIAGNOSIS — M25462 Effusion, left knee: Secondary | ICD-10-CM | POA: Diagnosis not present

## 2018-07-16 DIAGNOSIS — M25562 Pain in left knee: Secondary | ICD-10-CM | POA: Diagnosis not present

## 2018-07-16 DIAGNOSIS — R2689 Other abnormalities of gait and mobility: Secondary | ICD-10-CM | POA: Diagnosis not present

## 2018-07-16 DIAGNOSIS — Z96652 Presence of left artificial knee joint: Secondary | ICD-10-CM | POA: Diagnosis not present

## 2018-07-16 DIAGNOSIS — M62552 Muscle wasting and atrophy, not elsewhere classified, left thigh: Secondary | ICD-10-CM | POA: Diagnosis not present

## 2018-07-19 DIAGNOSIS — M62552 Muscle wasting and atrophy, not elsewhere classified, left thigh: Secondary | ICD-10-CM | POA: Diagnosis not present

## 2018-07-19 DIAGNOSIS — M25562 Pain in left knee: Secondary | ICD-10-CM | POA: Diagnosis not present

## 2018-07-19 DIAGNOSIS — R2689 Other abnormalities of gait and mobility: Secondary | ICD-10-CM | POA: Diagnosis not present

## 2018-07-19 DIAGNOSIS — M25462 Effusion, left knee: Secondary | ICD-10-CM | POA: Diagnosis not present

## 2018-07-19 DIAGNOSIS — Z96652 Presence of left artificial knee joint: Secondary | ICD-10-CM | POA: Diagnosis not present

## 2018-08-08 DIAGNOSIS — M25462 Effusion, left knee: Secondary | ICD-10-CM | POA: Diagnosis not present

## 2018-08-08 DIAGNOSIS — R2689 Other abnormalities of gait and mobility: Secondary | ICD-10-CM | POA: Diagnosis not present

## 2018-08-08 DIAGNOSIS — M62552 Muscle wasting and atrophy, not elsewhere classified, left thigh: Secondary | ICD-10-CM | POA: Diagnosis not present

## 2018-08-08 DIAGNOSIS — M25562 Pain in left knee: Secondary | ICD-10-CM | POA: Diagnosis not present

## 2018-08-08 DIAGNOSIS — Z96652 Presence of left artificial knee joint: Secondary | ICD-10-CM | POA: Diagnosis not present

## 2018-09-20 DIAGNOSIS — Z96652 Presence of left artificial knee joint: Secondary | ICD-10-CM | POA: Diagnosis not present

## 2018-09-20 DIAGNOSIS — Z471 Aftercare following joint replacement surgery: Secondary | ICD-10-CM | POA: Diagnosis not present

## 2018-10-18 DIAGNOSIS — Z6837 Body mass index (BMI) 37.0-37.9, adult: Secondary | ICD-10-CM | POA: Diagnosis not present

## 2018-10-18 DIAGNOSIS — E669 Obesity, unspecified: Secondary | ICD-10-CM | POA: Diagnosis not present

## 2018-10-18 DIAGNOSIS — R635 Abnormal weight gain: Secondary | ICD-10-CM | POA: Diagnosis not present

## 2018-10-18 DIAGNOSIS — Z79899 Other long term (current) drug therapy: Secondary | ICD-10-CM | POA: Diagnosis not present

## 2018-10-29 DIAGNOSIS — Z23 Encounter for immunization: Secondary | ICD-10-CM | POA: Diagnosis not present

## 2018-11-02 DIAGNOSIS — Z1231 Encounter for screening mammogram for malignant neoplasm of breast: Secondary | ICD-10-CM | POA: Diagnosis not present

## 2018-11-19 DIAGNOSIS — Z6835 Body mass index (BMI) 35.0-35.9, adult: Secondary | ICD-10-CM | POA: Diagnosis not present

## 2018-11-19 DIAGNOSIS — Z79899 Other long term (current) drug therapy: Secondary | ICD-10-CM | POA: Diagnosis not present

## 2018-11-19 DIAGNOSIS — Z1331 Encounter for screening for depression: Secondary | ICD-10-CM | POA: Diagnosis not present

## 2018-11-19 DIAGNOSIS — R635 Abnormal weight gain: Secondary | ICD-10-CM | POA: Diagnosis not present

## 2018-12-19 DIAGNOSIS — Z6834 Body mass index (BMI) 34.0-34.9, adult: Secondary | ICD-10-CM | POA: Diagnosis not present

## 2018-12-19 DIAGNOSIS — Z79899 Other long term (current) drug therapy: Secondary | ICD-10-CM | POA: Diagnosis not present

## 2018-12-19 DIAGNOSIS — R635 Abnormal weight gain: Secondary | ICD-10-CM | POA: Diagnosis not present

## 2018-12-28 DIAGNOSIS — E7801 Familial hypercholesterolemia: Secondary | ICD-10-CM | POA: Diagnosis not present

## 2018-12-28 DIAGNOSIS — I10 Essential (primary) hypertension: Secondary | ICD-10-CM | POA: Diagnosis not present

## 2018-12-28 DIAGNOSIS — Z79899 Other long term (current) drug therapy: Secondary | ICD-10-CM | POA: Diagnosis not present

## 2018-12-28 DIAGNOSIS — F325 Major depressive disorder, single episode, in full remission: Secondary | ICD-10-CM | POA: Diagnosis not present

## 2019-01-17 DIAGNOSIS — Z79899 Other long term (current) drug therapy: Secondary | ICD-10-CM | POA: Diagnosis not present

## 2019-01-17 DIAGNOSIS — E669 Obesity, unspecified: Secondary | ICD-10-CM | POA: Diagnosis not present

## 2019-01-17 DIAGNOSIS — Z6834 Body mass index (BMI) 34.0-34.9, adult: Secondary | ICD-10-CM | POA: Diagnosis not present

## 2019-01-17 DIAGNOSIS — R635 Abnormal weight gain: Secondary | ICD-10-CM | POA: Diagnosis not present

## 2019-01-31 DIAGNOSIS — R42 Dizziness and giddiness: Secondary | ICD-10-CM | POA: Diagnosis not present

## 2019-02-14 DIAGNOSIS — Z79899 Other long term (current) drug therapy: Secondary | ICD-10-CM | POA: Diagnosis not present

## 2019-02-14 DIAGNOSIS — Z6833 Body mass index (BMI) 33.0-33.9, adult: Secondary | ICD-10-CM | POA: Diagnosis not present

## 2019-02-14 DIAGNOSIS — E669 Obesity, unspecified: Secondary | ICD-10-CM | POA: Diagnosis not present

## 2019-02-14 DIAGNOSIS — R635 Abnormal weight gain: Secondary | ICD-10-CM | POA: Diagnosis not present

## 2019-02-15 DIAGNOSIS — J342 Deviated nasal septum: Secondary | ICD-10-CM | POA: Diagnosis not present

## 2019-02-15 DIAGNOSIS — J3489 Other specified disorders of nose and nasal sinuses: Secondary | ICD-10-CM | POA: Diagnosis not present

## 2019-02-15 DIAGNOSIS — H6121 Impacted cerumen, right ear: Secondary | ICD-10-CM | POA: Diagnosis not present

## 2019-02-15 DIAGNOSIS — R42 Dizziness and giddiness: Secondary | ICD-10-CM | POA: Diagnosis not present

## 2019-02-15 DIAGNOSIS — M26629 Arthralgia of temporomandibular joint, unspecified side: Secondary | ICD-10-CM | POA: Diagnosis not present

## 2019-02-15 DIAGNOSIS — R51 Headache: Secondary | ICD-10-CM | POA: Diagnosis not present

## 2019-03-18 DIAGNOSIS — R635 Abnormal weight gain: Secondary | ICD-10-CM | POA: Diagnosis not present

## 2019-03-18 DIAGNOSIS — Z79899 Other long term (current) drug therapy: Secondary | ICD-10-CM | POA: Diagnosis not present

## 2019-04-17 DIAGNOSIS — Z6832 Body mass index (BMI) 32.0-32.9, adult: Secondary | ICD-10-CM | POA: Diagnosis not present

## 2019-04-17 DIAGNOSIS — R635 Abnormal weight gain: Secondary | ICD-10-CM | POA: Diagnosis not present

## 2019-04-17 DIAGNOSIS — E669 Obesity, unspecified: Secondary | ICD-10-CM | POA: Diagnosis not present

## 2019-04-17 DIAGNOSIS — Z79899 Other long term (current) drug therapy: Secondary | ICD-10-CM | POA: Diagnosis not present

## 2019-04-23 DIAGNOSIS — H524 Presbyopia: Secondary | ICD-10-CM | POA: Diagnosis not present

## 2019-04-23 DIAGNOSIS — H04123 Dry eye syndrome of bilateral lacrimal glands: Secondary | ICD-10-CM | POA: Diagnosis not present

## 2019-05-20 DIAGNOSIS — E669 Obesity, unspecified: Secondary | ICD-10-CM | POA: Diagnosis not present

## 2019-05-20 DIAGNOSIS — Z6832 Body mass index (BMI) 32.0-32.9, adult: Secondary | ICD-10-CM | POA: Diagnosis not present

## 2019-05-20 DIAGNOSIS — R635 Abnormal weight gain: Secondary | ICD-10-CM | POA: Diagnosis not present

## 2019-05-20 DIAGNOSIS — Z79899 Other long term (current) drug therapy: Secondary | ICD-10-CM | POA: Diagnosis not present

## 2019-05-24 DIAGNOSIS — N3001 Acute cystitis with hematuria: Secondary | ICD-10-CM | POA: Diagnosis not present

## 2019-06-06 DIAGNOSIS — Z471 Aftercare following joint replacement surgery: Secondary | ICD-10-CM | POA: Diagnosis not present

## 2019-06-06 DIAGNOSIS — Z96652 Presence of left artificial knee joint: Secondary | ICD-10-CM | POA: Diagnosis not present

## 2019-06-24 DIAGNOSIS — Z01419 Encounter for gynecological examination (general) (routine) without abnormal findings: Secondary | ICD-10-CM | POA: Diagnosis not present

## 2019-06-24 DIAGNOSIS — N952 Postmenopausal atrophic vaginitis: Secondary | ICD-10-CM | POA: Diagnosis not present

## 2019-06-24 DIAGNOSIS — N905 Atrophy of vulva: Secondary | ICD-10-CM | POA: Diagnosis not present

## 2019-06-25 DIAGNOSIS — Z6833 Body mass index (BMI) 33.0-33.9, adult: Secondary | ICD-10-CM | POA: Diagnosis not present

## 2019-06-25 DIAGNOSIS — R635 Abnormal weight gain: Secondary | ICD-10-CM | POA: Diagnosis not present

## 2019-06-25 DIAGNOSIS — Z79899 Other long term (current) drug therapy: Secondary | ICD-10-CM | POA: Diagnosis not present

## 2019-07-02 DIAGNOSIS — E7801 Familial hypercholesterolemia: Secondary | ICD-10-CM | POA: Diagnosis not present

## 2019-07-02 DIAGNOSIS — Z79899 Other long term (current) drug therapy: Secondary | ICD-10-CM | POA: Diagnosis not present

## 2019-07-02 DIAGNOSIS — F325 Major depressive disorder, single episode, in full remission: Secondary | ICD-10-CM | POA: Diagnosis not present

## 2019-07-02 DIAGNOSIS — I10 Essential (primary) hypertension: Secondary | ICD-10-CM | POA: Diagnosis not present

## 2019-07-11 DIAGNOSIS — N3000 Acute cystitis without hematuria: Secondary | ICD-10-CM | POA: Diagnosis not present

## 2019-07-23 DIAGNOSIS — R635 Abnormal weight gain: Secondary | ICD-10-CM | POA: Diagnosis not present

## 2019-07-23 DIAGNOSIS — Z6832 Body mass index (BMI) 32.0-32.9, adult: Secondary | ICD-10-CM | POA: Diagnosis not present

## 2019-07-23 DIAGNOSIS — Z79899 Other long term (current) drug therapy: Secondary | ICD-10-CM | POA: Diagnosis not present

## 2019-08-22 DIAGNOSIS — Z79899 Other long term (current) drug therapy: Secondary | ICD-10-CM | POA: Diagnosis not present

## 2019-08-22 DIAGNOSIS — Z9181 History of falling: Secondary | ICD-10-CM | POA: Diagnosis not present

## 2019-08-22 DIAGNOSIS — Z139 Encounter for screening, unspecified: Secondary | ICD-10-CM | POA: Diagnosis not present

## 2019-08-22 DIAGNOSIS — R635 Abnormal weight gain: Secondary | ICD-10-CM | POA: Diagnosis not present

## 2019-09-18 DIAGNOSIS — M546 Pain in thoracic spine: Secondary | ICD-10-CM | POA: Diagnosis not present

## 2019-09-23 DIAGNOSIS — Z23 Encounter for immunization: Secondary | ICD-10-CM | POA: Diagnosis not present

## 2019-10-07 DIAGNOSIS — N3945 Continuous leakage: Secondary | ICD-10-CM | POA: Diagnosis not present

## 2019-10-07 DIAGNOSIS — N815 Vaginal enterocele: Secondary | ICD-10-CM | POA: Diagnosis not present

## 2019-10-07 DIAGNOSIS — Z90710 Acquired absence of both cervix and uterus: Secondary | ICD-10-CM | POA: Diagnosis not present

## 2019-10-17 DIAGNOSIS — M4047 Postural lordosis, lumbosacral region: Secondary | ICD-10-CM | POA: Diagnosis not present

## 2019-10-17 DIAGNOSIS — R2689 Other abnormalities of gait and mobility: Secondary | ICD-10-CM | POA: Diagnosis not present

## 2019-10-17 DIAGNOSIS — Z96652 Presence of left artificial knee joint: Secondary | ICD-10-CM | POA: Diagnosis not present

## 2019-10-17 DIAGNOSIS — M545 Low back pain: Secondary | ICD-10-CM | POA: Diagnosis not present

## 2019-10-22 DIAGNOSIS — Z96652 Presence of left artificial knee joint: Secondary | ICD-10-CM | POA: Diagnosis not present

## 2019-10-22 DIAGNOSIS — M545 Low back pain: Secondary | ICD-10-CM | POA: Diagnosis not present

## 2019-10-22 DIAGNOSIS — R2689 Other abnormalities of gait and mobility: Secondary | ICD-10-CM | POA: Diagnosis not present

## 2019-10-22 DIAGNOSIS — M4047 Postural lordosis, lumbosacral region: Secondary | ICD-10-CM | POA: Diagnosis not present

## 2019-10-24 DIAGNOSIS — M545 Low back pain: Secondary | ICD-10-CM | POA: Diagnosis not present

## 2019-10-24 DIAGNOSIS — M4047 Postural lordosis, lumbosacral region: Secondary | ICD-10-CM | POA: Diagnosis not present

## 2019-10-24 DIAGNOSIS — Z96652 Presence of left artificial knee joint: Secondary | ICD-10-CM | POA: Diagnosis not present

## 2019-10-24 DIAGNOSIS — R2689 Other abnormalities of gait and mobility: Secondary | ICD-10-CM | POA: Diagnosis not present

## 2019-10-28 DIAGNOSIS — G8929 Other chronic pain: Secondary | ICD-10-CM | POA: Diagnosis not present

## 2019-10-28 DIAGNOSIS — M546 Pain in thoracic spine: Secondary | ICD-10-CM | POA: Diagnosis not present

## 2019-10-28 DIAGNOSIS — M545 Low back pain: Secondary | ICD-10-CM | POA: Diagnosis not present

## 2019-11-01 DIAGNOSIS — Z96652 Presence of left artificial knee joint: Secondary | ICD-10-CM | POA: Diagnosis not present

## 2019-11-01 DIAGNOSIS — R2689 Other abnormalities of gait and mobility: Secondary | ICD-10-CM | POA: Diagnosis not present

## 2019-11-01 DIAGNOSIS — M4047 Postural lordosis, lumbosacral region: Secondary | ICD-10-CM | POA: Diagnosis not present

## 2019-11-01 DIAGNOSIS — M545 Low back pain: Secondary | ICD-10-CM | POA: Diagnosis not present

## 2019-11-04 DIAGNOSIS — M545 Low back pain: Secondary | ICD-10-CM | POA: Diagnosis not present

## 2019-11-04 DIAGNOSIS — Z96652 Presence of left artificial knee joint: Secondary | ICD-10-CM | POA: Diagnosis not present

## 2019-11-04 DIAGNOSIS — R2689 Other abnormalities of gait and mobility: Secondary | ICD-10-CM | POA: Diagnosis not present

## 2019-11-04 DIAGNOSIS — M4047 Postural lordosis, lumbosacral region: Secondary | ICD-10-CM | POA: Diagnosis not present

## 2019-11-06 DIAGNOSIS — M545 Low back pain: Secondary | ICD-10-CM | POA: Diagnosis not present

## 2019-11-06 DIAGNOSIS — Z96652 Presence of left artificial knee joint: Secondary | ICD-10-CM | POA: Diagnosis not present

## 2019-11-06 DIAGNOSIS — R2689 Other abnormalities of gait and mobility: Secondary | ICD-10-CM | POA: Diagnosis not present

## 2019-11-06 DIAGNOSIS — M4047 Postural lordosis, lumbosacral region: Secondary | ICD-10-CM | POA: Diagnosis not present

## 2019-11-12 DIAGNOSIS — M545 Low back pain: Secondary | ICD-10-CM | POA: Diagnosis not present

## 2019-11-12 DIAGNOSIS — Z96652 Presence of left artificial knee joint: Secondary | ICD-10-CM | POA: Diagnosis not present

## 2019-11-12 DIAGNOSIS — R2689 Other abnormalities of gait and mobility: Secondary | ICD-10-CM | POA: Diagnosis not present

## 2019-11-12 DIAGNOSIS — M4047 Postural lordosis, lumbosacral region: Secondary | ICD-10-CM | POA: Diagnosis not present

## 2019-11-15 DIAGNOSIS — M545 Low back pain: Secondary | ICD-10-CM | POA: Diagnosis not present

## 2019-11-15 DIAGNOSIS — M4047 Postural lordosis, lumbosacral region: Secondary | ICD-10-CM | POA: Diagnosis not present

## 2019-11-15 DIAGNOSIS — Z96652 Presence of left artificial knee joint: Secondary | ICD-10-CM | POA: Diagnosis not present

## 2019-11-15 DIAGNOSIS — R2689 Other abnormalities of gait and mobility: Secondary | ICD-10-CM | POA: Diagnosis not present

## 2019-12-25 DIAGNOSIS — R928 Other abnormal and inconclusive findings on diagnostic imaging of breast: Secondary | ICD-10-CM | POA: Diagnosis not present

## 2020-01-02 DIAGNOSIS — F325 Major depressive disorder, single episode, in full remission: Secondary | ICD-10-CM | POA: Diagnosis not present

## 2020-01-02 DIAGNOSIS — E7801 Familial hypercholesterolemia: Secondary | ICD-10-CM | POA: Diagnosis not present

## 2020-01-02 DIAGNOSIS — Z79899 Other long term (current) drug therapy: Secondary | ICD-10-CM | POA: Diagnosis not present

## 2020-01-02 DIAGNOSIS — I10 Essential (primary) hypertension: Secondary | ICD-10-CM | POA: Diagnosis not present

## 2020-01-29 DIAGNOSIS — U071 COVID-19: Secondary | ICD-10-CM | POA: Diagnosis not present

## 2020-04-23 DIAGNOSIS — N3001 Acute cystitis with hematuria: Secondary | ICD-10-CM | POA: Diagnosis not present

## 2020-07-01 DIAGNOSIS — Z01419 Encounter for gynecological examination (general) (routine) without abnormal findings: Secondary | ICD-10-CM | POA: Diagnosis not present

## 2020-07-01 DIAGNOSIS — N905 Atrophy of vulva: Secondary | ICD-10-CM | POA: Diagnosis not present

## 2020-07-01 DIAGNOSIS — K469 Unspecified abdominal hernia without obstruction or gangrene: Secondary | ICD-10-CM | POA: Diagnosis not present

## 2020-07-01 DIAGNOSIS — Z90710 Acquired absence of both cervix and uterus: Secondary | ICD-10-CM | POA: Diagnosis not present

## 2020-07-01 DIAGNOSIS — N952 Postmenopausal atrophic vaginitis: Secondary | ICD-10-CM | POA: Diagnosis not present

## 2020-07-07 DIAGNOSIS — R5383 Other fatigue: Secondary | ICD-10-CM | POA: Diagnosis not present

## 2020-07-07 DIAGNOSIS — Z79899 Other long term (current) drug therapy: Secondary | ICD-10-CM | POA: Diagnosis not present

## 2020-07-07 DIAGNOSIS — F325 Major depressive disorder, single episode, in full remission: Secondary | ICD-10-CM | POA: Diagnosis not present

## 2020-07-07 DIAGNOSIS — E7801 Familial hypercholesterolemia: Secondary | ICD-10-CM | POA: Diagnosis not present

## 2020-07-07 DIAGNOSIS — I10 Essential (primary) hypertension: Secondary | ICD-10-CM | POA: Diagnosis not present

## 2020-07-21 DIAGNOSIS — L578 Other skin changes due to chronic exposure to nonionizing radiation: Secondary | ICD-10-CM | POA: Diagnosis not present

## 2020-07-21 DIAGNOSIS — L821 Other seborrheic keratosis: Secondary | ICD-10-CM | POA: Diagnosis not present

## 2020-07-21 DIAGNOSIS — D2239 Melanocytic nevi of other parts of face: Secondary | ICD-10-CM | POA: Diagnosis not present

## 2020-09-08 DIAGNOSIS — Z0184 Encounter for antibody response examination: Secondary | ICD-10-CM | POA: Diagnosis not present

## 2020-09-08 DIAGNOSIS — N3001 Acute cystitis with hematuria: Secondary | ICD-10-CM | POA: Diagnosis not present

## 2020-09-10 DIAGNOSIS — M1711 Unilateral primary osteoarthritis, right knee: Secondary | ICD-10-CM | POA: Diagnosis not present

## 2020-10-12 DIAGNOSIS — J209 Acute bronchitis, unspecified: Secondary | ICD-10-CM | POA: Diagnosis not present

## 2021-02-28 DIAGNOSIS — I10 Essential (primary) hypertension: Secondary | ICD-10-CM | POA: Insufficient documentation

## 2021-07-09 DIAGNOSIS — K469 Unspecified abdominal hernia without obstruction or gangrene: Secondary | ICD-10-CM | POA: Insufficient documentation

## 2021-10-04 ENCOUNTER — Ambulatory Visit (INDEPENDENT_AMBULATORY_CARE_PROVIDER_SITE_OTHER): Payer: Medicare Other | Admitting: Podiatry

## 2021-10-04 ENCOUNTER — Other Ambulatory Visit: Payer: Self-pay

## 2021-10-04 ENCOUNTER — Ambulatory Visit (INDEPENDENT_AMBULATORY_CARE_PROVIDER_SITE_OTHER): Payer: Medicare Other

## 2021-10-04 DIAGNOSIS — M722 Plantar fascial fibromatosis: Secondary | ICD-10-CM | POA: Diagnosis not present

## 2021-10-04 DIAGNOSIS — M7731 Calcaneal spur, right foot: Secondary | ICD-10-CM | POA: Diagnosis not present

## 2021-10-04 DIAGNOSIS — M216X9 Other acquired deformities of unspecified foot: Secondary | ICD-10-CM | POA: Diagnosis not present

## 2021-10-04 MED ORDER — BETAMETHASONE SOD PHOS & ACET 6 (3-3) MG/ML IJ SUSP
6.0000 mg | Freq: Once | INTRAMUSCULAR | Status: AC
  Administered 2021-10-04: 6 mg

## 2021-10-04 NOTE — Progress Notes (Signed)
  Subjective:  Patient ID: Tamara Barber, female    DOB: 03/06/1951,  MRN: [id number]  Chief Complaint  Patient presents with   Pain    Rt bottom heel pain x 1 mo - no injury - 9/10 sharp - stabbing constant pain - w/ occasional swelling -worse with standing/barefooted tx: IBU     70 y.o. female presents with the above complaint. History confirmed with patient.  Objective:  Physical Exam: warm, good capillary refill, no trophic changes or ulcerative lesions, normal DP and PT pulses, and normal sensory exam. Right Foot: tenderness to palpation medial calcaneal tuber, no pain with calcaneal squeeze, decreased ankle joint ROM, and +Silverskiold test normal exam, no swelling, tenderness, instability; ligaments intact, full range of motion of all ankle/foot joints other than findings noted above.  Radiographs: X-ray of the right foot: no evidence of calcaneal stress fracture, plantar calcaneal spur, posterior calcaneal spur, Haglund deformity noted, and loose osseous bodies plantar fascia  Assessment:   1. Equinus deformity of foot   2. Plantar fasciitis of right foot   3. Calcaneal spur of right foot      Plan:  Patient was evaluated and treated and all questions answered.  Plantar Fasciitis -XR reviewed with patient -Educated patient on stretching and icing of the affected limb -Plantar fascial brace dispensed -Injection delivered to the plantar fascia of the right foot.  Procedure: Injection Tendon/Ligament Consent: Verbal consent obtained. Location: Right plantar fascia at the glabrous junction; medial approach. Skin Prep: Alcohol. Injectate: 1 cc 0.5% marcaine plain, 1 cc betamethasone acetate-betamethasone sodium phosphate Disposition: Patient tolerated procedure well. Injection site dressed with a band-aid.  Return in about 1 month (around 11/04/2021) for Plantar fasciitis.

## 2021-10-04 NOTE — Patient Instructions (Signed)

## 2021-10-11 ENCOUNTER — Telehealth: Payer: Self-pay | Admitting: Podiatry

## 2021-10-11 MED ORDER — METHYLPREDNISOLONE 4 MG PO TBPK: ORAL_TABLET | ORAL | 0 refills | Status: DC

## 2021-10-11 NOTE — Telephone Encounter (Signed)
Pt was seen last Monday and given injection.  States she has had no relief at all.  Has been using water bottle multiple times a day with no relief.  Pls advise. [employer]

## 2021-10-11 NOTE — Telephone Encounter (Signed)
Pt notified/reb °

## 2021-10-11 NOTE — Addendum Note (Signed)
Addended by: Hardie Pulley on: 10/11/2021 03:47 PM   Modules accepted: Orders

## 2021-10-18 ENCOUNTER — Other Ambulatory Visit: Payer: Self-pay

## 2021-10-18 ENCOUNTER — Ambulatory Visit (INDEPENDENT_AMBULATORY_CARE_PROVIDER_SITE_OTHER): Payer: Medicare Other | Admitting: Podiatry

## 2021-10-18 DIAGNOSIS — M722 Plantar fascial fibromatosis: Secondary | ICD-10-CM | POA: Diagnosis not present

## 2021-10-18 DIAGNOSIS — M216X9 Other acquired deformities of unspecified foot: Secondary | ICD-10-CM

## 2021-10-18 DIAGNOSIS — M7731 Calcaneal spur, right foot: Secondary | ICD-10-CM

## 2021-10-18 MED ORDER — BETAMETHASONE SOD PHOS & ACET 6 (3-3) MG/ML IJ SUSP
6.0000 mg | Freq: Once | INTRAMUSCULAR | Status: AC
  Administered 2021-10-18: 6 mg

## 2021-10-18 NOTE — Progress Notes (Signed)
  Subjective:  Patient ID: Tamara Barber, female    DOB: 1951/04/24,  MRN: [id number]  Chief Complaint  Patient presents with   Plantar Fasciitis    F/U Rt pF -pt states," it still hurts about the same, no improvement with the shoe; 8-9/10 sharp constant pain ." - no swelling tx: brace , startching and completed medrol pack    70 y.o. female presents with the above complaint. History confirmed with patient.  Objective:  Physical Exam: warm, good capillary refill, no trophic changes or ulcerative lesions, normal DP and PT pulses, and normal sensory exam. Right Foot: no tenderness to palpation medial calcaneal tuber, POP lateral calcaneal tuber; no pain with calcaneal squeeze, decreased ankle joint ROM, and +Silverskiold test normal exam, no swelling, tenderness, instability; ligaments intact, full range of motion of all ankle/foot joints other than findings noted above.  Assessment:   1. Plantar fasciitis of right foot   2. Equinus deformity of foot   3. Calcaneal spur of right foot    Plan:  Patient was evaluated and treated and all questions answered.  Plantar Fasciitis -CAM boot dispensed for immobilization given consistent pain -Repeat injection, though through lateral approach as this is more symptomatic today -Ok to continue Voltaren gel.  Procedure: Injection Tendon/Ligament Consent: Verbal consent obtained. Location: Right plantar fascia at the glabrous junction; lateral approach. Skin Prep: Alcohol. Injectate: 1 cc 0.5% marcaine plain, 1 cc betamethasone acetate-betamethasone sodium phosphate Disposition: Patient tolerated procedure well. Injection site dressed with a band-aid.  No follow-ups on file.

## 2021-11-11 ENCOUNTER — Ambulatory Visit: Payer: Medicare Other | Admitting: Podiatry

## 2021-11-11 ENCOUNTER — Ambulatory Visit (INDEPENDENT_AMBULATORY_CARE_PROVIDER_SITE_OTHER): Payer: Medicare Other | Admitting: Podiatry

## 2021-11-11 ENCOUNTER — Encounter: Payer: Self-pay | Admitting: Podiatry

## 2021-11-11 DIAGNOSIS — M722 Plantar fascial fibromatosis: Secondary | ICD-10-CM

## 2021-11-11 DIAGNOSIS — M7731 Calcaneal spur, right foot: Secondary | ICD-10-CM | POA: Diagnosis not present

## 2021-11-11 DIAGNOSIS — M216X9 Other acquired deformities of unspecified foot: Secondary | ICD-10-CM | POA: Diagnosis not present

## 2021-11-11 NOTE — Patient Instructions (Signed)

## 2021-11-11 NOTE — Progress Notes (Signed)
  Subjective:  Patient ID: Tamara Barber, female    DOB: March 08, 1951,  MRN: [id number]  Chief Complaint  Patient presents with   Plantar Fasciitis    I am better on the right foot and I do have good and bad days and the boot was amazing and did help    70 y.o. female presents with the above complaint. History confirmed with patient.  Objective:  Physical Exam: warm, good capillary refill, no trophic changes or ulcerative lesions, normal DP and PT pulses, and normal sensory exam. Right Foot: no tenderness to palpation medial calcaneal tuber, no POP lateral calcaneal tuber; no pain with calcaneal squeeze, decreased ankle joint ROM, and +Silverskiold test   Assessment:   1. Plantar fasciitis of right foot   2. Equinus deformity of foot   3. Calcaneal spur of right foot     Plan:  Patient was evaluated and treated and all questions answered.  Plantar Fasciitis -No further consistent pain. Out of the boot back in normal shoegear -Discussed importance of stretching to prevent recurrence of plantar fasciitis and prevent Achilles tendonitis. -F/u PRN should issues worsen.   Return if symptoms worsen or fail to improve.

## 2022-02-21 ENCOUNTER — Encounter: Payer: Self-pay | Admitting: Podiatry

## 2022-02-21 ENCOUNTER — Other Ambulatory Visit: Payer: Self-pay

## 2022-02-21 ENCOUNTER — Ambulatory Visit (INDEPENDENT_AMBULATORY_CARE_PROVIDER_SITE_OTHER): Payer: Medicare Other | Admitting: Podiatry

## 2022-02-21 DIAGNOSIS — M722 Plantar fascial fibromatosis: Secondary | ICD-10-CM | POA: Diagnosis not present

## 2022-02-21 MED ORDER — DEXAMETHASONE SODIUM PHOSPHATE 120 MG/30ML IJ SOLN
4.0000 mg | Freq: Once | INTRAMUSCULAR | Status: AC
  Administered 2022-02-21: 4 mg via INTRA_ARTICULAR

## 2022-02-21 NOTE — Progress Notes (Signed)
?  Subjective:  ?Patient ID: Tamara Barber, female    DOB: 05-Nov-1951,   MRN: [id number] ? ?No chief complaint on file. ? ? ?71 y.o. female presents for concern of right heel pain that has returned. Was treated by Dr. March Rummage previously and had injection and put in CAM boot. Relates it has worsened especially on the right more than the left.  . Denies any other pedal complaints. Denies n/v/f/c.  ? ?Past Medical History:  ?Diagnosis Date  ? Depression   ? Hypertension   ? PONV (postoperative nausea and vomiting)   ? ? ?Objective:  ?Physical Exam: ?Vascular: DP/PT pulses 2/4 bilateral. CFT <3 seconds. Normal hair growth on digits. No edema.  ?Skin. No lacerations or abrasions bilateral feet.  ?Musculoskeletal: MMT 5/5 bilateral lower extremities in DF, PF, Inversion and Eversion. Deceased ROM in DF of ankle joint. Tender to medial calcaneal tubercle on the right. No pain with calcaneal squeeze. No pain with Achilles or PT tendon. No pain along arch.  ?Neurological: Sensation intact to light touch.  ? ?Assessment:  ? ?1. Plantar fasciitis of right foot   ? ? ? ?Plan:  ?Patient was evaluated and treated and all questions answered. ?Discussed plantar fasciitis with patient.  ?X-rays reviewed and discussed with patient. No acute fractures or dislocations noted. Mild spurring noted at inferior calcaneus.  ?Discussed treatment options including, ice, NSAIDS, supportive shoes, bracing, and stretching.  ?Continue stretching and anti-infalmmatories. Marland Kitchen   ?Referral to PT. ?Discussed CMO patient will ocnsider.  ?Patient requesting injection today. Procedure note below.   ?Follow-up 8 weeks or sooner if any problems arise. In the meantime, encouraged to call the office with any questions, concerns, change in symptoms.  ? ?Procedure:  ?Discussed etiology, pathology, conservative vs. surgical therapies. At this time a plantar fascial injection was recommended.  The patient agreed and a sterile skin prep was applied.  An injection  consisting of  dexamethasone and marcaine mixture was infiltrated at the point of maximal tenderness on the bilateral Heel.  Bandaid applied. The patient tolerated this well and was given instructions for aftercare.   ? ? ?Lorenda Peck, DPM  ? ? ?

## 2022-02-23 ENCOUNTER — Telehealth: Payer: Self-pay | Admitting: Podiatry

## 2022-02-23 NOTE — Telephone Encounter (Signed)
[facility] called stating the patient reached out to them for an appt. However, they never received an order for physical therapy. Please advise.  ?

## 2022-02-23 NOTE — Telephone Encounter (Signed)
Sent the physical therapy form to [facility] today and the fax number is [phone] ?

## 2022-03-28 ENCOUNTER — Ambulatory Visit (INDEPENDENT_AMBULATORY_CARE_PROVIDER_SITE_OTHER): Payer: Medicare Other | Admitting: Podiatry

## 2022-03-28 ENCOUNTER — Encounter: Payer: Self-pay | Admitting: Podiatry

## 2022-03-28 DIAGNOSIS — M722 Plantar fascial fibromatosis: Secondary | ICD-10-CM | POA: Diagnosis not present

## 2022-03-28 MED ORDER — METHYLPREDNISOLONE 4 MG PO TBPK: ORAL_TABLET | ORAL | 0 refills | Status: AC

## 2022-03-28 NOTE — Progress Notes (Signed)
?  Subjective:  ?Patient ID: Tamara Barber, female    DOB: Oct 31, 1951,   MRN: [id number] ? ?Chief Complaint  ?Patient presents with  ? Plantar Fasciitis  ?  I am not any better and the shots did not really help and I hurt in the am and sharp dull achy pain feels like needles and stabbing pain and I got new shoes and inserts and they do not help  ? ? ?71 y.o. female presents follow-up or heel pain bilateral. Relates nothing has been helping. Injections didn't help and neither did PT.   Marland Kitchen Denies any other pedal complaints. Denies n/v/f/c.  ? ?Past Medical History:  ?Diagnosis Date  ? Depression   ? Hypertension   ? PONV (postoperative nausea and vomiting)   ? ? ?Objective:  ?Physical Exam: ?Vascular: DP/PT pulses 2/4 bilateral. CFT <3 seconds. Normal hair growth on digits. No edema.  ?Skin. No lacerations or abrasions bilateral feet.  ?Musculoskeletal: MMT 5/5 bilateral lower extremities in DF, PF, Inversion and Eversion. Deceased ROM in DF of ankle joint. Tender to medial calcaneal tubercle on the right. No pain with calcaneal squeeze. No pain with Achilles or PT tendon. No pain along arch.  ?Neurological: Sensation intact to light touch.  ? ?Assessment:  ? ?1. Plantar fasciitis of right foot   ?2. Plantar fasciitis of left foot   ? ? ? ?Plan:  ?Patient was evaluated and treated and all questions answered. ?Discussed plantar fasciitis with patient.  ?X-rays reviewed and discussed with patient. No acute fractures or dislocations noted. Mild spurring noted at inferior calcaneus.  ?Discussed treatment options including, ice, NSAIDS, supportive shoes, bracing, and stretching.  ?Continue stretching and anti-infalmmatories. Marland Kitchen ?Medrol dose pack provided.  ?MRI ordered for bilateraly feet.  ?Discussed possible PRP vs EPAT or vs surgery.  ?Follow-up after MRI  ? ? ? ?Lorenda Peck, DPM  ? ? ?

## 2022-04-09 ENCOUNTER — Ambulatory Visit
Admission: RE | Admit: 2022-04-09 | Discharge: 2022-04-09 | Disposition: A | Discharge status: Home / Self Care | Payer: Medicare Other | Source: Ambulatory Visit | Attending: Podiatry | Admitting: Podiatry

## 2022-04-09 DIAGNOSIS — M722 Plantar fascial fibromatosis: Secondary | ICD-10-CM

## 2022-04-09 IMAGING — MR MR HEEL *L* W/O CM
5 series · 40 of 40 positions shown · non-contrast
Comparison: None available

CLINICAL DATA: Chronic bilateral heel pain.  Surgical planning.

EXAM:
MR OF THE LEFT HEEL WITHOUT CONTRAST
TECHNIQUE: Multiplanar, multisequence MR imaging of the left heel was
performed. No intravenous contrast was administered.

[Series 4: PD fat-sat · axial · 3.0mm · 0.56mm/px · z∈[-98,+33]mm · 8 of 36 slices shown]
[im 1/36]
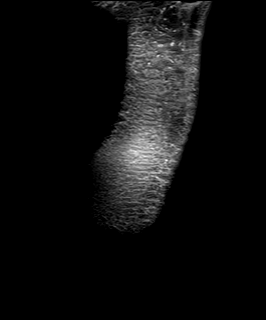
[im 6/36]
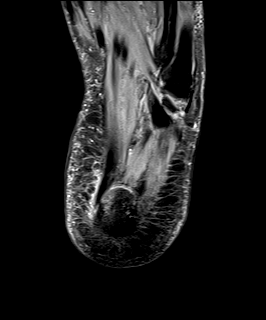
[im 11/36]
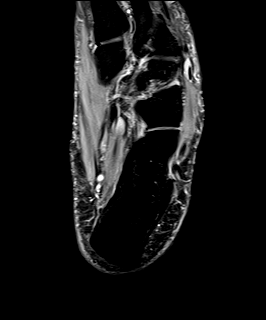
[im 16/36]
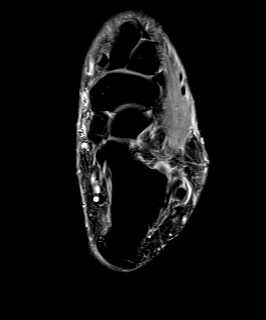
[im 21/36]
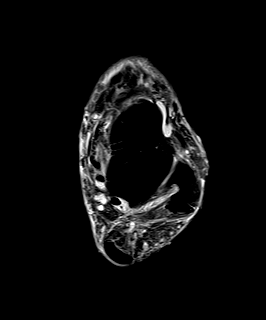
[im 26/36]
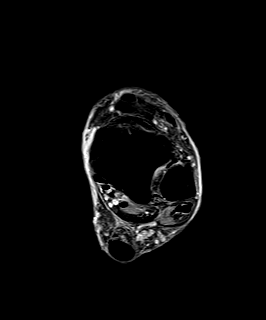
[im 31/36]
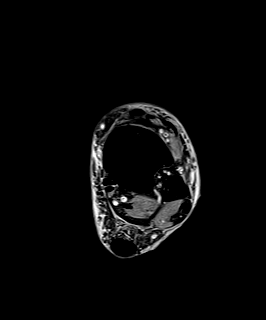
[im 36/36]
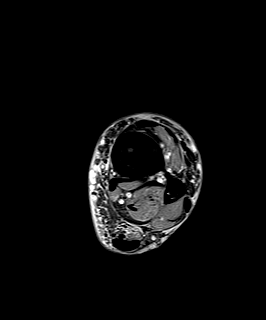

[Series 5: T2 fat-sat · axial · 3.0mm · 0.53mm/px · z∈[-102,+38]mm · 9 of 36 slices shown (1 of 2)]
[im 1/36]
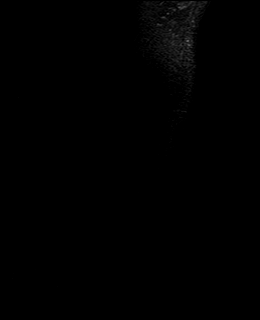
[im 5/36]
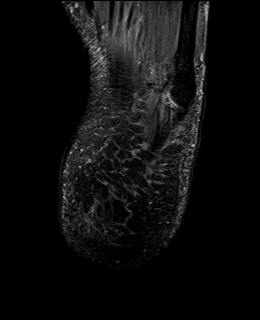
[im 9/36]
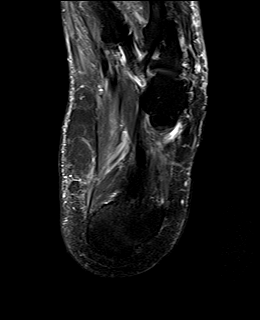
[im 14/36]
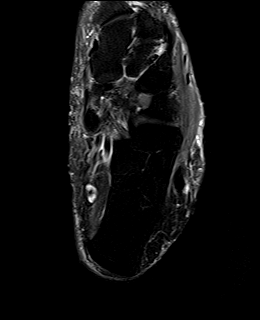
[im 18/36]
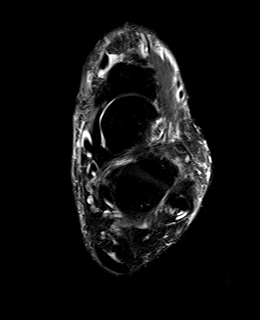
[im 22/36]
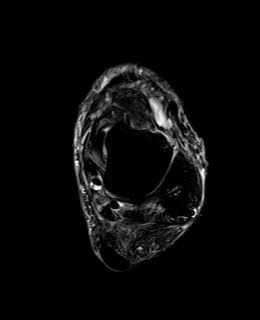
[im 27/36]
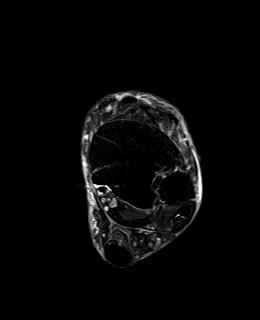
[im 31/36]
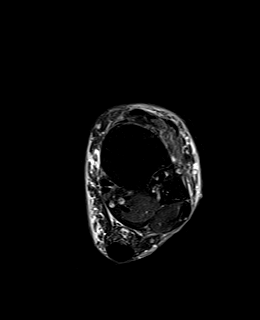
[im 36/36]
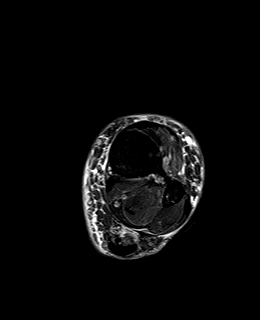

[Series 6: T1 · sagittal · 4.0mm · 0.70mm/px · 6 of 22 slices shown]
[im 1/22]
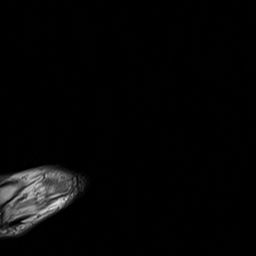
[im 5/22]
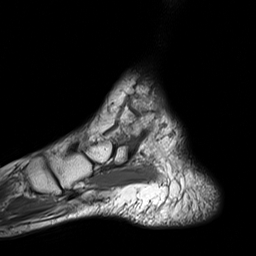
[im 9/22]
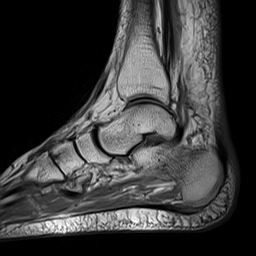
[im 13/22]
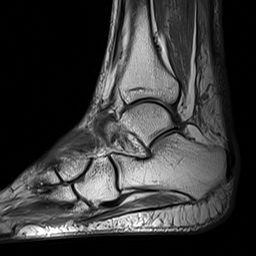
[im 17/22]
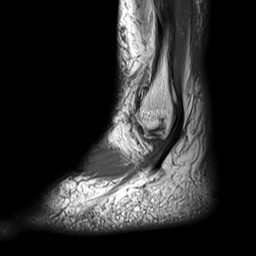
[im 22/22]
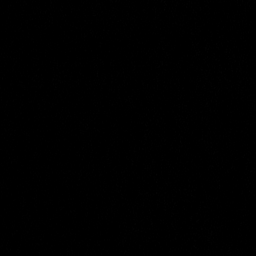

[Series 7: STIR · sagittal · 4.0mm · 0.70mm/px · 6 of 22 slices shown]
[im 1/22]
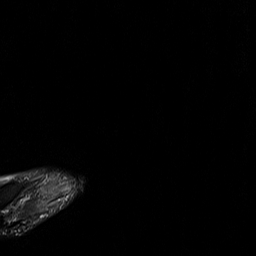
[im 5/22]
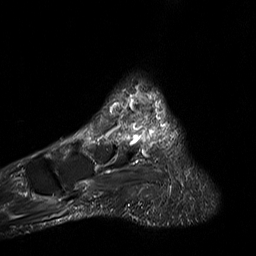
[im 9/22]
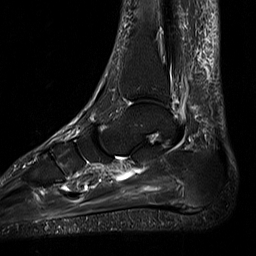
[im 13/22]
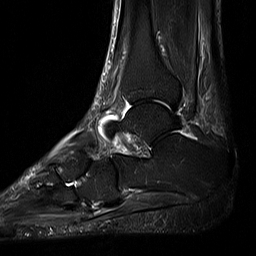
[im 17/22]
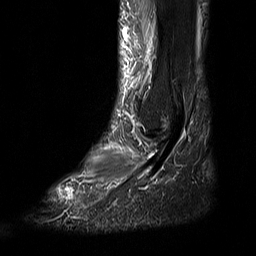
[im 22/22]
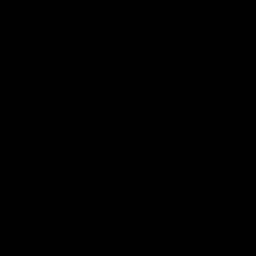

[Series 8: T2 fat-sat · coronal · 3.0mm · 0.62mm/px · 11 of 42 slices shown (2 of 2)]
[im 1/42]
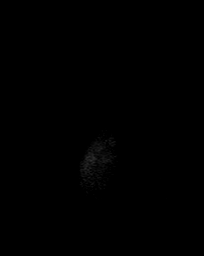
[im 5/42]
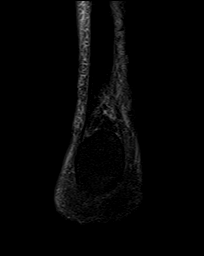
[im 9/42]
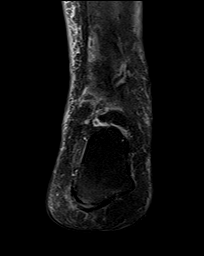
[im 13/42]
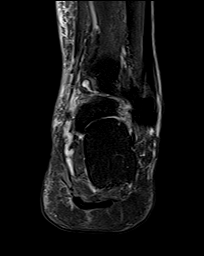
[im 17/42]
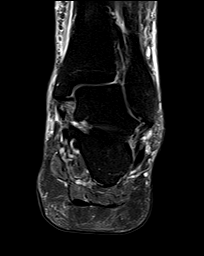
[im 21/42]
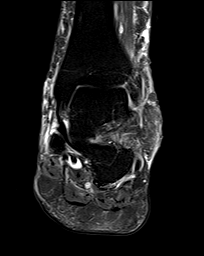
[im 25/42]
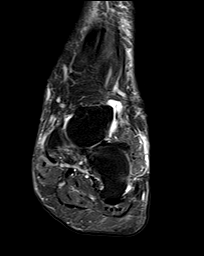
[im 29/42]
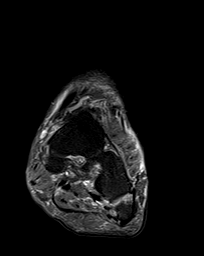
[im 33/42]
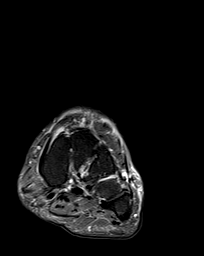
[im 37/42]
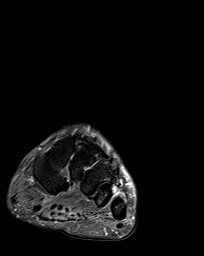
[im 42/42]
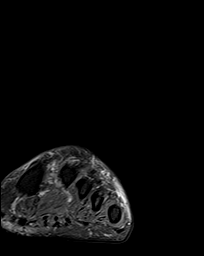

[40 of 40 positions shown; findings below may reference images not displayed]

FINDINGS: TENDONS

Peroneal: There is ALAMARI "ALAMARI configuration" of the peroneus brevis
tendon starting at the distal fibular metadiaphysis and involving an
approximate 5.5 cm length of the tendon (axial series 5, images 10
through 23). At the level of the distal fibular metadiaphysis
through the region just distal to the fibula (axial images 11
through 21), this tear extends through both the posterior and
anterior surfaces of the tendon as a complete AP split tear. The
peroneus longus tendon is intact.

Posteromedial: Mild posterior tibial, flexor digitorum longus, and
flexor hallucis longus tenosynovitis.

Anterior: Mild extensor digitorum longus tenosynovitis. The tibialis
anterior and extensor hallucis longus tendons are intact.

Achilles: Minimal thickening of the avascular zone of the Achilles
tendon centered approximately 5 cm proximal to the Achilles tendon
insertion with normal low signal, possibly the sequela of remote
tendinosis. No increased T2 signal to indicate active tendinosis. No
fluid bright tear.

Plantar Fascia: Moderate plantar calcaneal heel spur with
mild-to-moderate associated posteroinferior calcaneal marrow edema.
Mild-to-moderate medial band and mild lateral band of the plantar
fascia thickening. There is increased T2 signal within the
midsubstance of the medial band of the plantar fascia origin
moderate plantar fasciitis. No fluid bright tear or tendon
retraction.

LIGAMENTS

Lateral: Minimal intermediate T2 signal and thickening at the talar
insertion of the anterior talofibular ligament likely remote sprain.
The calcaneofibular and posterior talofibular, and anterior and
posterior tibiofibular ligaments are intact.

Medial: The tibiotalar deep deltoid and tibial spring ligaments are
intact.

CARTILAGE

Ankle Joint: Intact cartilage.

Subtalar Joints/Sinus Tarsi: Fat is preserved within sinus tarsi.

Bones: Mild cartilage thinning and dorsal degenerative osteophytosis
at the talonavicular joint. Moderate cartilage thinning and
subchondral edema/cystic change within the second through fourth
tarsometatarsal joints.

Other: Mild talonavicular joint effusion mildly extending along the
dorsal and lateral aspect of the talar neck.

The tarsal tunnel is unremarkable. The Lisfranc ligament complex is
intact.
IMPRESSION: :
IMPRESSION: 1. Longitudinal split tear of the peroneus brevis tendon extending
along an approximate 5.5 cm length of the tendon. A portion of this
tendon extends through both the anterior and posterior tendon
surfaces, a full-thickness AP split tear.
2. Mild posterior tibial, flexor digitorum longus and flexor
hallucis longus tenosynovitis.
3. Moderate plantar calcaneal heel spur with mild-to-moderate
associated marrow edema. Moderate medial plantar fasciitis.
4. Remote sprain of the anterior talofibular ligament.

## 2022-04-09 IMAGING — MR MR HEEL *R* W/O CM
5 series · 40 of 40 positions shown · non-contrast
Comparison: right foot radiographs [DATE]

CLINICAL DATA: Chronic bilateral heel pain.  Surgical planning.

EXAM:
MR OF THE RIGHT HEEL WITHOUT CONTRAST
TECHNIQUE: Multiplanar, multisequence MR imaging of the right heel was
performed. No intravenous contrast was administered.

[Series 3: T2 fat-sat · axial · 3.0mm · 0.50mm/px · z∈[-88,+51]mm · 10 of 36 slices shown (1 of 2)]
[im 1/36]
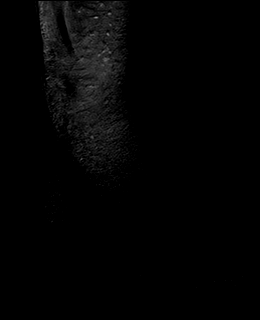
[im 4/36]
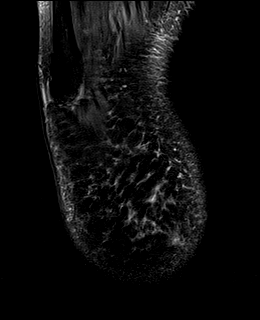
[im 8/36]
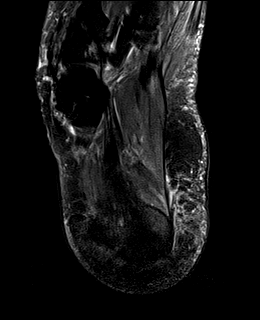
[im 12/36]
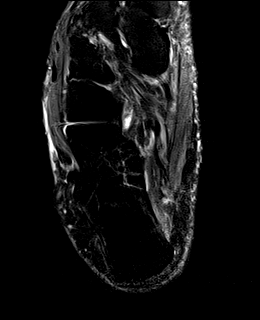
[im 16/36]
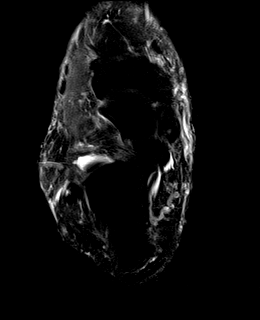
[im 20/36]
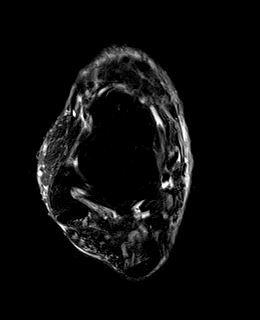
[im 24/36]
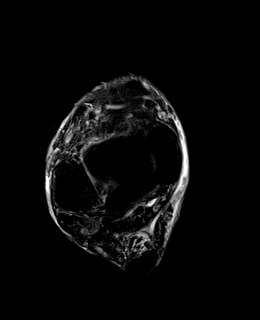
[im 28/36]
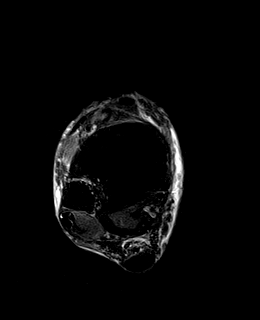
[im 32/36]
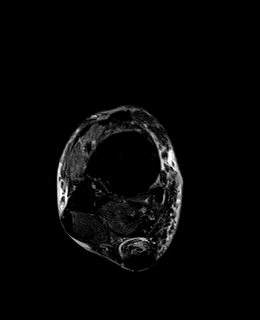
[im 36/36]
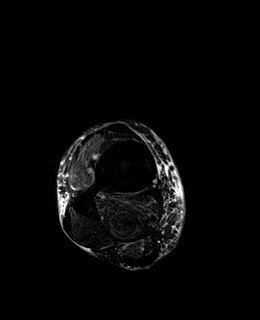

[Series 4: PD fat-sat · axial · 3.5mm · 0.56mm/px · z∈[-88,+39]mm · 8 of 30 slices shown]
[im 1/30]
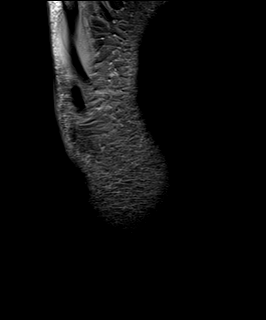
[im 5/30]
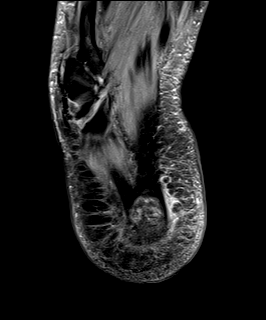
[im 9/30]
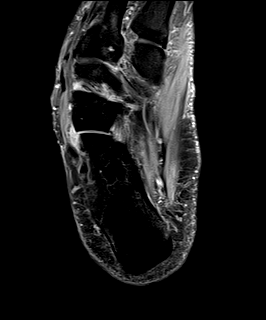
[im 13/30]
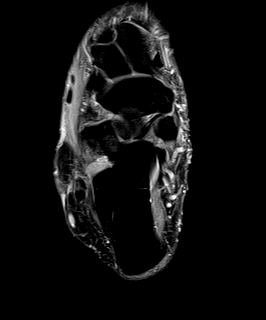
[im 17/30]
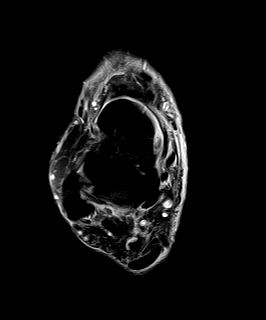
[im 21/30]
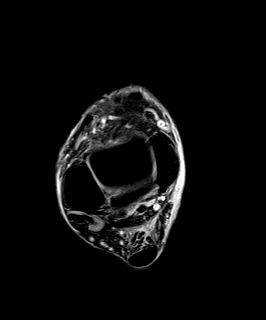
[im 25/30]
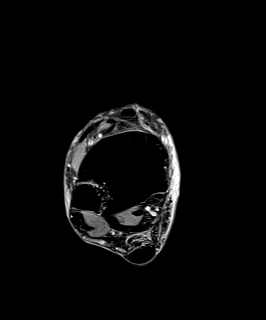
[im 30/30]
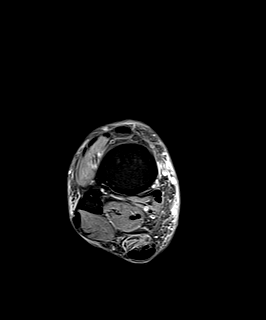

[Series 5: T2 fat-sat · coronal · 3.0mm · 0.62mm/px · 10 of 39 slices shown (2 of 2)]
[im 1/39]
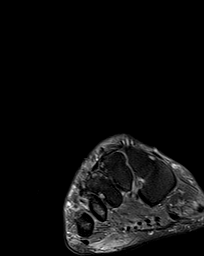
[im 5/39]
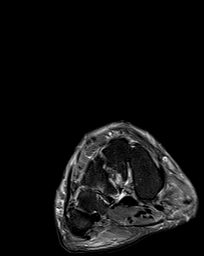
[im 9/39]
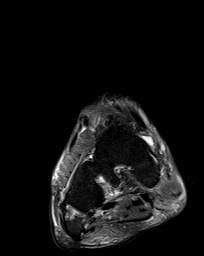
[im 13/39]
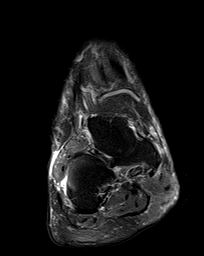
[im 17/39]
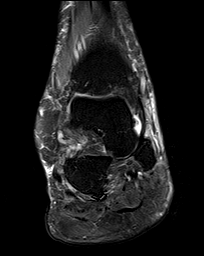
[im 22/39]
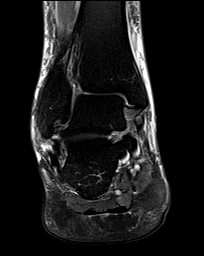
[im 26/39]
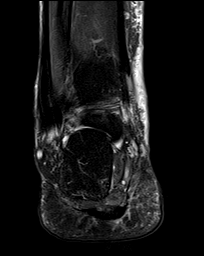
[im 30/39]
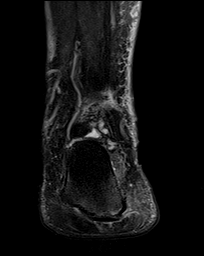
[im 34/39]
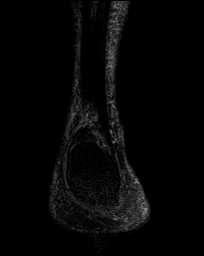
[im 39/39]
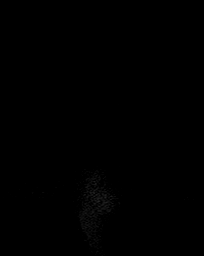

[Series 6: T1 · sagittal · 4.0mm · 0.70mm/px · 6 of 22 slices shown]
[im 1/22]
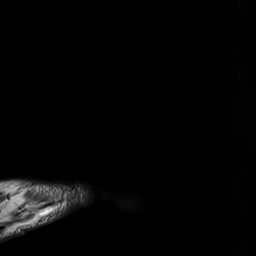
[im 5/22]
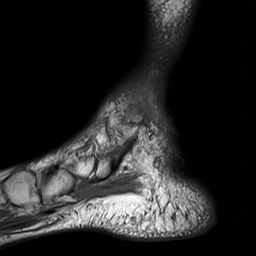
[im 9/22]
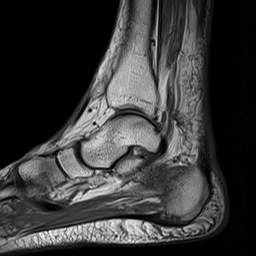
[im 13/22]
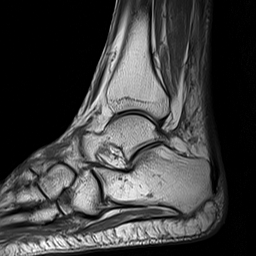
[im 17/22]
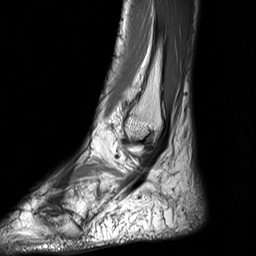
[im 22/22]
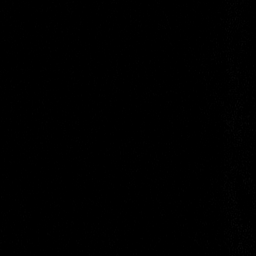

[Series 7: STIR · sagittal · 4.0mm · 0.70mm/px · 6 of 22 slices shown]
[im 1/22]
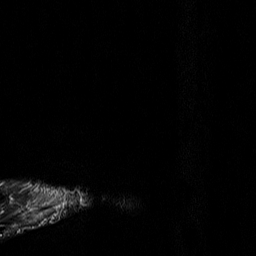
[im 5/22]
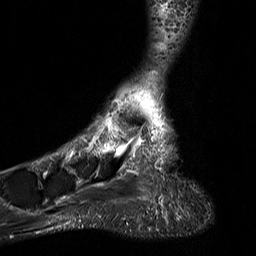
[im 9/22]
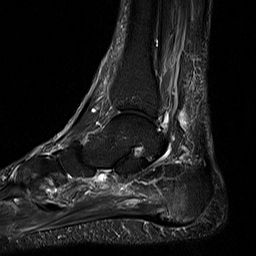
[im 13/22]
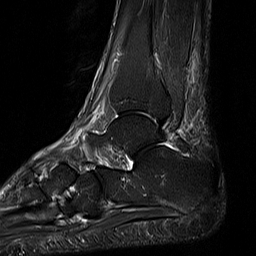
[im 17/22]
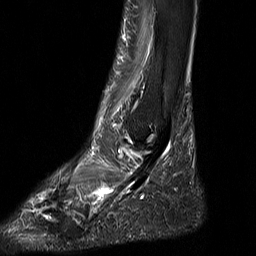
[im 22/22]
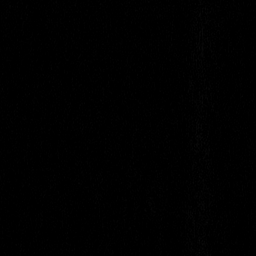

[40 of 40 positions shown; findings below may reference images not displayed]

FINDINGS: TENDONS

Peroneal: There is BENRABAH configuration of the peroneus brevis
tendon starting at the distal femoral diaphysis (axial image 9) and
extending through an approximate 6 cm length of the tendon (axial
images 9 through 25). At the level of the distal fibular metaphysis
through just pass the distal fibula this tear extends through both
the anterior and posterior tendon surfaces and there is a complete
split tear. Mild peroneus longus tendinosis.

Posteromedial: Mild posterior tibial, flexor digitorum longus, and
flexor hallucis longus tenosynovitis.

Anterior: Mild tibialis anterior tenosynovitis.

Achilles: Intact.

Plantar Fascia: Moderate plantar calcaneal heel spur with moderate
associated posteroinferior calcaneal marrow edema. Minimal
intermediate T2 signal within the plantar fascia origin, likely
minimal chronic plantar fasciitis.

LIGAMENTS

Lateral: There is intermediate T2 signal and attenuation of the
anterior talofibular ligament, likely remote partial-thickness tear
and sprain. The calcaneofibular, posterior talofibular, and
posterior tibiofibular ligaments are intact. Mild thickening of the
anterior tibiofibular ligament, likely a remote sprain.

Medial: The tibiotalar deep deltoid and tibial spring ligaments are
intact.

CARTILAGE

Ankle Joint: Partial-thickness thinning of the posterolateral aspect
of the tibial plafond cartilage with mild thinning of the adjacent
posterolateral talar dome cartilage (coronal series 5 images 17 and
18, sagittal image 10).

Subtalar Joints/Sinus Tarsi: Fat is preserved within sinus tarsi.

Bones: Mild talonavicular cartilage thinning and dorsal degenerative
osteophytosis.

Other: The tarsal tunnel is unremarkable. The Lisfranc ligament
complex is intact.
IMPRESSION: :
IMPRESSION: 1. Longitudinal split tear of the peroneus brevis tendon measuring
approximately 6 cm in length. At the mid aspect of this tear, the
tear extends through both the anterior and posterior tendon
surfaces, a complete AP split tear.
2. Moderate plantar calcaneal heel spur with moderate associated
marrow edema. Minimal intermediate T2 signal active plantar
fasciitis.
3. Partial-thickness posterolateral tibiotalar cartilage thinning.
4. Mild posterior tibial, flexor digitorum longus, and flexor
hallucis longus tenosynovitis.

## 2022-04-11 ENCOUNTER — Ambulatory Visit: Payer: Medicare Other | Admitting: Podiatry

## 2022-04-25 ENCOUNTER — Ambulatory Visit (INDEPENDENT_AMBULATORY_CARE_PROVIDER_SITE_OTHER): Payer: Medicare Other | Admitting: Podiatry

## 2022-04-25 ENCOUNTER — Encounter: Payer: Self-pay | Admitting: Podiatry

## 2022-04-25 DIAGNOSIS — M722 Plantar fascial fibromatosis: Secondary | ICD-10-CM

## 2022-04-25 DIAGNOSIS — M216X9 Other acquired deformities of unspecified foot: Secondary | ICD-10-CM

## 2022-04-25 NOTE — Progress Notes (Signed)
?  Subjective:  ?Patient ID: Tamara Barber, female    DOB: 1951/11/29,   MRN: [id number] ? ?No chief complaint on file. ? ? ?71 y.o. female presents follow-up or heel pain bilateral. Here to go over MRI results and discuss treatment options   . Denies any other pedal complaints. Denies n/v/f/c.  ? ?Past Medical History:  ?Diagnosis Date  ? Depression   ? Hypertension   ? PONV (postoperative nausea and vomiting)   ? ? ?Objective:  ?Physical Exam: ?Vascular: DP/PT pulses 2/4 bilateral. CFT <3 seconds. Normal hair growth on digits. No edema.  ?Skin. No lacerations or abrasions bilateral feet.  ?Musculoskeletal: MMT 5/5 bilateral lower extremities in DF, PF, Inversion and Eversion. Deceased ROM in DF of ankle joint. Tender to medial calcaneal tubercle on the right. No pain with calcaneal squeeze. No pain with Achilles or PT tendon. No pain along arch.  ?Neurological: Sensation intact to light touch.  ? ?Assessment:  ? ?1. Plantar fasciitis of right foot   ?2. Plantar fasciitis of left foot   ?3. Equinus deformity of foot   ? ?MRI  ?IMPRESSION: LEFT foot  ?1. Longitudinal split tear of the peroneus brevis tendon extending ?along an approximate 5.5 cm length of the tendon. A portion of this ?tendon extends through both the anterior and posterior tendon ?surfaces, a full-thickness AP split tear. ?2. Mild posterior tibial, flexor digitorum longus and flexor ?hallucis longus tenosynovitis. ?3. Moderate plantar calcaneal heel spur with mild-to-moderate ?associated marrow edema. Moderate medial plantar fasciitis. ?4. Remote sprain of the anterior talofibular ligament. ? ? ?RIGHT foot MRI  ?1. Longitudinal split tear of the peroneus brevis tendon measuring ?approximately 6 cm in length. At the mid aspect of this tear, the ?tear extends through both the anterior and posterior tendon ?surfaces, a complete AP split tear. ?2. Moderate plantar calcaneal heel spur with moderate associated ?marrow edema. Minimal intermediate T2  signal active plantar ?fasciitis. ?3. Partial-thickness posterolateral tibiotalar cartilage thinning. ?4. Mild posterior tibial, flexor digitorum longus, and flexor ?hallucis longus tenosynovitis. ? ?Plan:  ?Patient was evaluated and treated and all questions answered. ?Discussed plantar fasciitis with patient.  ?X-rays reviewed and discussed with patient. No acute fractures or dislocations noted. Mild spurring noted at inferior calcaneus.  ?Discussed treatment options including, ice, NSAIDS, supportive shoes, bracing, and stretching.  ?Continue stretching and anti-infalmmatories. Marland Kitchen ?MRI reviewed and discussed.  ?Patient would like to proceed with PRP injection.  ?Will call to get scheduled.  ? ? ?Lorenda Peck, DPM  ? ? ?

## 2022-05-17 ENCOUNTER — Ambulatory Visit (INDEPENDENT_AMBULATORY_CARE_PROVIDER_SITE_OTHER): Payer: Self-pay | Admitting: Podiatry

## 2022-05-17 ENCOUNTER — Encounter: Payer: Self-pay | Admitting: Podiatry

## 2022-05-17 DIAGNOSIS — M722 Plantar fascial fibromatosis: Secondary | ICD-10-CM

## 2022-05-17 NOTE — Progress Notes (Signed)
  Subjective:  Patient ID: Tamara Barber, female    DOB: September 20, 1951,   MRN: [id number]  Chief Complaint  Patient presents with   Plantar Fasciitis    PRP injection right foot     71 y.o. female presents for PRP injection into right foot. Relates doing about the same. Ready for injection. Denies any other pedal complaints. Denies n/v/f/c.   Past Medical History:  Diagnosis Date   Depression    Hypertension    PONV (postoperative nausea and vomiting)     Objective:  Physical Exam: Vascular: DP/PT pulses 2/4 bilateral. CFT <3 seconds. Normal hair growth on digits. No edema.  Skin. No lacerations or abrasions bilateral feet.  Musculoskeletal: MMT 5/5 bilateral lower extremities in DF, PF, Inversion and Eversion. Deceased ROM in DF of ankle joint. Tender to medial calcaneal tubercle on the right. No pain with calcaneal squeeze. No pain with Achilles or PT tendon. No pain along arch.  Neurological: Sensation intact to light touch.   Assessment:   1. Plantar fasciitis of right foot   2. Plantar fasciitis of left foot    MRI  IMPRESSION: LEFT foot  1. Longitudinal split tear of the peroneus brevis tendon extending along an approximate 5.5 cm length of the tendon. A portion of this tendon extends through both the anterior and posterior tendon surfaces, a full-thickness AP split tear. 2. Mild posterior tibial, flexor digitorum longus and flexor hallucis longus tenosynovitis. 3. Moderate plantar calcaneal heel spur with mild-to-moderate associated marrow edema. Moderate medial plantar fasciitis. 4. Remote sprain of the anterior talofibular ligament.   RIGHT foot MRI  1. Longitudinal split tear of the peroneus brevis tendon measuring approximately 6 cm in length. At the mid aspect of this tear, the tear extends through both the anterior and posterior tendon surfaces, a complete AP split tear. 2. Moderate plantar calcaneal heel spur with moderate associated marrow edema. Minimal  intermediate T2 signal active plantar fasciitis. 3. Partial-thickness posterolateral tibiotalar cartilage thinning. 4. Mild posterior tibial, flexor digitorum longus, and flexor hallucis longus tenosynovitis.  Plan:  Patient was evaluated and treated and all questions answered. Discussed plantar fasciitis with patient.  X-rays reviewed and discussed with patient. No acute fractures or dislocations noted. Mild spurring noted at inferior calcaneus.  Discussed treatment options including, ice, NSAIDS, supportive shoes, bracing, and stretching.  Proceeded with PRP injection. Procedure below.  Will follow-up in 4 weeks.   Procedure: Injection Tendon/Ligament Discussed alternatives, risks, complications and verbal consent was obtained.  Location: Right plantar fascia. Skin Prep: Alcohol. Injectate: 1cc 1% lidocaine plain. Followed by PRP aspirate and spun form prior blood draw.  Disposition: Patient tolerated procedure well. Injection site dressed with a band-aid.  Post-injection care was discussed and return precautions discussed.    Lorenda Peck, DPM

## 2022-05-23 ENCOUNTER — Other Ambulatory Visit: Payer: Self-pay | Admitting: Podiatry

## 2022-05-23 ENCOUNTER — Telehealth: Payer: Self-pay | Admitting: Podiatry

## 2022-05-23 NOTE — Telephone Encounter (Signed)
I spoke to patient and she states tylenol doesn't really help her but she will try ibuprofen and if this doesn't help she will callback for the medrol dose pack.

## 2022-05-23 NOTE — Telephone Encounter (Signed)
Pt states that her foot still hurts post injection and want to know what she can take for the pain.   [place]

## 2022-05-23 NOTE — Telephone Encounter (Signed)
She can take tyelnol and see if this helps. If not let us know and I can sent in prescription for medrol dose pack if she would like.  Thanks

## 2022-06-20 ENCOUNTER — Encounter: Payer: Self-pay | Admitting: Podiatry

## 2022-06-20 ENCOUNTER — Ambulatory Visit (INDEPENDENT_AMBULATORY_CARE_PROVIDER_SITE_OTHER): Payer: Medicare Other | Admitting: Podiatry

## 2022-06-20 DIAGNOSIS — M722 Plantar fascial fibromatosis: Secondary | ICD-10-CM

## 2022-06-20 NOTE — Progress Notes (Signed)
  Subjective:  Patient ID: Tamara Barber, female    DOB: Apr 29, 1951,   MRN: [id number]  Chief Complaint  Patient presents with   Plantar Fasciitis    71 y.o. female presents for follow-up after right PRP injection for plantar fasciiits. Relates is is doing a lot better about 85%. She is happing. Relates left still tender but not bad enough yet.  Denies any other pedal complaints. Denies n/v/f/c.   Past Medical History:  Diagnosis Date   Depression    Hypertension    PONV (postoperative nausea and vomiting)     Objective:  Physical Exam: Vascular: DP/PT pulses 2/4 bilateral. CFT <3 seconds. Normal hair growth on digits. No edema.  Skin. No lacerations or abrasions bilateral feet.  Musculoskeletal: MMT 5/5 bilateral lower extremities in DF, PF, Inversion and Eversion. Deceased ROM in DF of ankle joint. Tender to medial calcaneal tubercle on the right. No pain with calcaneal squeeze. No pain with Achilles or PT tendon. No pain along arch.  Neurological: Sensation intact to light touch.   Assessment:   No diagnosis found.  MRI  IMPRESSION: LEFT foot  1. Longitudinal split tear of the peroneus brevis tendon extending along an approximate 5.5 cm length of the tendon. A portion of this tendon extends through both the anterior and posterior tendon surfaces, a full-thickness AP split tear. 2. Mild posterior tibial, flexor digitorum longus and flexor hallucis longus tenosynovitis. 3. Moderate plantar calcaneal heel spur with mild-to-moderate associated marrow edema. Moderate medial plantar fasciitis. 4. Remote sprain of the anterior talofibular ligament.   RIGHT foot MRI  1. Longitudinal split tear of the peroneus brevis tendon measuring approximately 6 cm in length. At the mid aspect of this tear, the tear extends through both the anterior and posterior tendon surfaces, a complete AP split tear. 2. Moderate plantar calcaneal heel spur with moderate associated marrow edema.  Minimal intermediate T2 signal active plantar fasciitis. 3. Partial-thickness posterolateral tibiotalar cartilage thinning. 4. Mild posterior tibial, flexor digitorum longus, and flexor hallucis longus tenosynovitis.  Plan:  Patient was evaluated and treated and all questions answered. Discussed plantar fasciitis with patient.  X-rays reviewed and discussed with patient. No acute fractures or dislocations noted. Mild spurring noted at inferior calcaneus.  Discussed treatment options including, ice, Continue with support and stretching If continues to have problems on left will consider PRP in the future.  Will follow-up as needed    Lorenda Peck, DPM

## 2024-02-19 ENCOUNTER — Other Ambulatory Visit: Payer: Self-pay | Admitting: Physician Assistant

## 2024-02-19 DIAGNOSIS — M545 Low back pain, unspecified: Secondary | ICD-10-CM

## 2024-02-26 NOTE — Discharge Instructions (Signed)

## 2024-02-27 ENCOUNTER — Ambulatory Visit
Admission: RE | Admit: 2024-02-27 | Discharge: 2024-02-27 | Disposition: A | Discharge status: Home / Self Care | Source: Ambulatory Visit | Attending: Physician Assistant | Admitting: Physician Assistant

## 2024-02-27 DIAGNOSIS — M545 Low back pain, unspecified: Secondary | ICD-10-CM

## 2024-02-27 MED ORDER — IOPAMIDOL (ISOVUE-M 200) INJECTION 41%
1.0000 mL | Freq: Once | INTRAMUSCULAR | Status: AC
  Administered 2024-02-27: 1 mL via EPIDURAL

## 2024-02-27 MED ORDER — METHYLPREDNISOLONE ACETATE 40 MG/ML INJ SUSP (RADIOLOG
80.0000 mg | Freq: Once | INTRAMUSCULAR | Status: AC
  Administered 2024-02-27: 80 mg via EPIDURAL

## 2024-05-09 ENCOUNTER — Other Ambulatory Visit (HOSPITAL_BASED_OUTPATIENT_CLINIC_OR_DEPARTMENT_OTHER): Payer: Self-pay | Admitting: Family Medicine

## 2024-05-09 DIAGNOSIS — Z13820 Encounter for screening for osteoporosis: Secondary | ICD-10-CM

## 2024-05-09 DIAGNOSIS — M8589 Other specified disorders of bone density and structure, multiple sites: Secondary | ICD-10-CM

## 2024-05-13 ENCOUNTER — Ambulatory Visit (HOSPITAL_BASED_OUTPATIENT_CLINIC_OR_DEPARTMENT_OTHER)
Admission: RE | Admit: 2024-05-13 | Discharge: 2024-05-13 | Disposition: A | Discharge status: Home / Self Care | Source: Ambulatory Visit | Attending: Family Medicine | Admitting: Family Medicine

## 2024-05-13 DIAGNOSIS — Z780 Asymptomatic menopausal state: Secondary | ICD-10-CM

## 2024-05-13 DIAGNOSIS — Z13820 Encounter for screening for osteoporosis: Secondary | ICD-10-CM | POA: Diagnosis not present

## 2024-05-13 DIAGNOSIS — M8589 Other specified disorders of bone density and structure, multiple sites: Secondary | ICD-10-CM

## 2024-05-26 ENCOUNTER — Ambulatory Visit (HOSPITAL_BASED_OUTPATIENT_CLINIC_OR_DEPARTMENT_OTHER)
Admit: 2024-05-26 | Discharge: 2024-05-26 | Disposition: A | Discharge status: Home / Self Care | Attending: Family Medicine | Admitting: Family Medicine

## 2024-05-26 ENCOUNTER — Ambulatory Visit (HOSPITAL_BASED_OUTPATIENT_CLINIC_OR_DEPARTMENT_OTHER)
Admission: EM | Admit: 2024-05-26 | Discharge: 2024-05-26 | Disposition: A | Discharge status: Home / Self Care | Attending: Family Medicine | Admitting: Family Medicine

## 2024-05-26 ENCOUNTER — Encounter (HOSPITAL_BASED_OUTPATIENT_CLINIC_OR_DEPARTMENT_OTHER): Payer: Self-pay | Admitting: Emergency Medicine

## 2024-05-26 DIAGNOSIS — M25511 Pain in right shoulder: Secondary | ICD-10-CM

## 2024-05-26 DIAGNOSIS — M7551 Bursitis of right shoulder: Secondary | ICD-10-CM

## 2024-05-26 NOTE — Discharge Instructions (Signed)
 Bursitis right shoulder: Joint injection to the right shoulder with anterior approach.  Clean the site with warm soapy fingers, rinse, pat dry.  Observe the site for any redness, swelling, to touch tenderness.  Those are signs of infection and reasons to return.  Try shoulder exercises as noted in handout.  Follow-up with primary care or orthopedics if problem persists.  X-rays were negative.  Follow-up here as needed.

## 2024-05-26 NOTE — ED Provider Notes (Addendum)
 [facility]    CSN: [id number] Arrival date & time: 05/26/24  1042      History   Chief Complaint No chief complaint on file.   HPI Tamara Barber is a 73 y.o. female.   73 year old female who reports worsening right shoulder pain since 04/23/2024.  She does not have any memory of trauma nor injury.  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  She is using an over-the-counter lidocaine patch on her posterior right shoulder for pain.  Pain is getting worse every day.     Past Medical History:  Diagnosis Date   Depression    Hypertension    PONV (postoperative nausea and vomiting)     Patient Active Problem List   Diagnosis Date Noted   Enterocele 07/09/2021   Essential hypertension 02/28/2021   S/P total knee replacement 06/04/2018   BMI 37.0-37.9, adult 04/10/2018   Hematochezia 04/10/2018   Chronic pain of right knee 03/09/2018   Chronic pain of both shoulders 03/16/2017    Past Surgical History:  Procedure Laterality Date   ABDOMINAL HYSTERECTOMY     bladder tack x2     CHOLECYSTECTOMY  2016   COLONOSCOPY  03/2018   FINGER SURGERY Right    cyst removed from index finger   GANGLION CYST EXCISION Left    wrist, forearm   MENISCUS REPAIR Left    TOTAL KNEE ARTHROPLASTY Left 06/04/2018   Procedure: TOTAL KNEE ARTHROPLASTY;  Surgeon: Christie Cox, MD;  Location: [facility] OR;  Service: Orthopedics;  Laterality: Left;    OB History   No obstetric history on file.      Home Medications    Prior to Admission medications   Medication Sig Start Date End Date Taking? Authorizing Provider  atenolol  (TENORMIN ) 50 MG tablet Take 50 mg by mouth daily.   Yes [provider]  atorvastatin  (LIPITOR) 10 MG tablet Take 10 mg by mouth daily.   Yes [provider]  sertraline  (ZOLOFT ) 100 MG tablet Take 100 mg by mouth daily.   Yes [provider]  cetirizine (ZYRTEC) 10 MG tablet Take 10 mg by mouth  daily.    [provider]  methocarbamol  (ROBAXIN ) 500 MG tablet Take 1-2 tablets (500-1,000 mg total) by mouth every 6 (six) hours as needed for muscle spasms. 06/05/18   Janeal Mealing, PA  methylPREDNISolone  (MEDROL  DOSEPAK) 4 MG TBPK tablet 6 Day Taper Pack. Take as Directed. 03/28/22   Sikora, Rebecca, DPM  naphazoline-glycerin (CLEAR EYES REDNESS RELIEF) 0.012-0.2 % SOLN Place 1 drop into both eyes 4 (four) times daily as needed (for redness relief).    [provider]  naproxen (NAPROSYN) 500 MG tablet Take 500 mg by mouth 2 (two) times daily. 12/17/21   [provider]  predniSONE (DELTASONE) 10 MG tablet Take by mouth. 12/17/21   [provider]    Family History History reviewed. No pertinent family history.  Social History Social History   Tobacco Use   Smoking status: Never   Smokeless tobacco: Never  Substance Use Topics   Alcohol use: Not Currently   Drug use: Never     Allergies   Patient has no known allergies.   Review of Systems Review of Systems  Constitutional:  Negative for fever.  Respiratory:  Negative for cough.   Cardiovascular:  Negative for chest pain.  Gastrointestinal:  Negative for abdominal pain, constipation, diarrhea, nausea and vomiting.  Musculoskeletal:  Positive for arthralgias (Right shoulder pain). Negative for back pain.  Skin:  Negative for color change and rash.  Neurological:  Negative for syncope.  All other systems reviewed and are negative.    Physical Exam Triage Vital Signs ED Triage Vitals  Encounter Vitals Group     BP 05/26/24 1055 (!) 191/82     Girls Systolic BP Percentile --      Girls Diastolic BP Percentile --      Boys Systolic BP Percentile --      Boys Diastolic BP Percentile --      Pulse Rate 05/26/24 1055 (!) 56     Resp 05/26/24 1055 18     Temp 05/26/24 1055 97.7 F (36.5 C)     Temp Source 05/26/24 1055 Oral     SpO2 05/26/24 1055 94 %     Weight --      Height  --      Head Circumference --      Peak Flow --      Pain Score 05/26/24 1054 9     Pain Loc --      Pain Education --      Exclude from Growth Chart --    No data found.  Updated Vital Signs BP (!) 191/82 (BP Location: Right Arm)   Pulse (!) 56   Temp 97.7 F (36.5 C) (Oral)   Resp 18   SpO2 94%   Visual Acuity Right Eye Distance:   Left Eye Distance:   Bilateral Distance:    Right Eye Near:   Left Eye Near:    Bilateral Near:     Physical Exam Vitals and nursing note reviewed.  Constitutional:      General: She is not in acute distress.    Appearance: She is well-developed. She is not ill-appearing or toxic-appearing.  HENT:     Head: Normocephalic and atraumatic.     Right Ear: Hearing, tympanic membrane, ear canal and external ear normal.     Left Ear: Hearing, tympanic membrane, ear canal and external ear normal.     Nose: No congestion or rhinorrhea.     Right Sinus: No maxillary sinus tenderness or frontal sinus tenderness.     Left Sinus: No maxillary sinus tenderness or frontal sinus tenderness.     Mouth/Throat:     Lips: Pink.     Mouth: Mucous membranes are moist.     Pharynx: Uvula midline. No oropharyngeal exudate or posterior oropharyngeal erythema.     Tonsils: No tonsillar exudate.   Eyes:     Conjunctiva/sclera: Conjunctivae normal.     Pupils: Pupils are equal, round, and reactive to light.    Cardiovascular:     Rate and Rhythm: Normal rate and regular rhythm.     Heart sounds: S1 normal and S2 normal. No murmur heard. Pulmonary:     Effort: Pulmonary effort is normal. No respiratory distress.     Breath sounds: Normal breath sounds. No decreased breath sounds, wheezing, rhonchi or rales.  Abdominal:     General: Bowel sounds are normal.     Palpations: Abdomen is soft.     Tenderness: There is no abdominal tenderness.   Musculoskeletal:        General: No swelling.     Right shoulder: Tenderness (With palpation around the shoulder  and right upper back scapula) present. No swelling. Decreased range of motion (Due to pain).     Left shoulder: Normal.  Right upper arm: Tenderness (On the upper arm and with some range of motion) present. No swelling or edema.     Left upper arm: Normal.     Right elbow: Normal.     Left elbow: Normal.     Cervical back: Neck supple.  Lymphadenopathy:     Head:     Right side of head: No submental, submandibular, tonsillar, preauricular or posterior auricular adenopathy.     Left side of head: No submental, submandibular, tonsillar, preauricular or posterior auricular adenopathy.     Cervical: No cervical adenopathy.     Right cervical: No superficial cervical adenopathy.    Left cervical: No superficial cervical adenopathy.   Skin:    General: Skin is warm and dry.     Capillary Refill: Capillary refill takes less than 2 seconds.     Findings: No rash.   Neurological:     Mental Status: She is alert and oriented to person, place, and time.   Psychiatric:        Mood and Affect: Mood normal.      UC Treatments / Results  Labs (all labs ordered are listed, but only abnormal results are displayed) Labs Reviewed - No data to display  EKG   Radiology DG Shoulder Right Result Date: 05/26/2024 EXAM: 4 VIEW(S) XRAY OF THE RIGHT SHOULDER 05/26/2024 12:08:00 PM COMPARISON: None available. CLINICAL HISTORY: Right shoulder pain. Pt c/o right upper arm pain started Friday now she has pain in her shoulder blade today also. Pt has lidocaine patch to area. No trauma. Repetitive motions in her work. FINDINGS: BONES AND JOINTS: Glenohumeral joint is normally aligned. No acute fracture or dislocation. The [facility] joint is unremarkable in appearance. Mild degenerative changes are present at the right humeral head. SOFT TISSUES: No abnormal calcifications. Visualized lung is unremarkable. IMPRESSION: 1. Mild degenerative changes at the right humeral head. Electronically signed by: Audree Leas MD 05/26/2024 12:17 PM EDT RP Workstation: [id number]    Procedures Join Aspiration/Injection  Date/Time: 05/26/2024 12:47 PM  Performed by: Guss Legacy, FNP Authorized by: Guss Legacy, FNP   Consent:    Consent obtained:  Verbal   Consent given by:  Patient   Risks, benefits, and alternatives were discussed: yes     Risks discussed:  Bleeding, incomplete drainage, nerve damage, infection, pain and poor cosmetic result   Alternatives discussed:  No treatment, delayed treatment, alternative treatment, observation and referral Universal protocol:    Procedure explained and questions answered to patient or proxy's satisfaction: yes     Immediately prior to procedure, a time out was called: yes     Patient identity confirmed:  Verbally with patient Location:    Location:  Shoulder   Shoulder:  R subacromial bursa Anesthesia:    Anesthesia method:  None Procedure details:    Preparation: Patient was prepped and draped in usual sterile fashion     Needle gauge:  22 G   Ultrasound guidance: no     Approach:  Anterior   Aspirate amount:  0 ml   Steroid injected: yes     Specimen collected: no   Post-procedure details:    Dressing:  Adhesive bandage   Procedure completion:  Tolerated well, no immediate complications  (including critical care time)  Medications Ordered in UC Medications - No data to display  Initial Impression / Assessment and Plan / UC Course  I have reviewed the triage vital signs and the nursing notes.  Pertinent labs & imaging results  that were available during my care of the patient were reviewed by me and considered in my medical decision making (see chart for details).  Plan of Care: Bursitis right shoulder: Injected the right shoulder and Kenalog  instilled with lidocaine (1 ml/40 mg of Kenalog  and 4 ml of 1% plain Lidocaine).  Some improvement of pain after the procedure.  Range of motion significantly improved after the procedure.  See  discharge instructions for patient instructions on management of the right shoulder and indications of infection.  Follow-up with primary care, orthopedics or return here if needed.  Advise she may use over-the-counter acetaminophen , 500 to 750 mg every 6 hours if needed for pain.  She should not use other NSAIDs since she has had a steroid injection in the shoulder.  I reviewed the plan of care with the patient and/or the patient's guardian.  The patient and/or guardian had time to ask questions and acknowledged that the questions were answered.  I provided instruction on symptoms or reasons to return here or to go to an ER, if symptoms/condition did not improve, worsened or if new symptoms occurred.  Final Clinical Impressions(s) / UC Diagnoses   Final diagnoses:  Acute pain of right shoulder  Bursitis of right shoulder     Discharge Instructions      Bursitis right shoulder: Joint injection to the right shoulder with anterior approach.  Clean the site with warm soapy fingers, rinse, pat dry.  Observe the site for any redness, swelling, to touch tenderness.  Those are signs of infection and reasons to return.  Try shoulder exercises as noted in handout.  Follow-up with primary care or orthopedics if problem persists.  X-rays were negative.  Follow-up here as needed.     ED Prescriptions   None    PDMP not reviewed this encounter.   Guss Legacy, FNP 05/26/24 1250    Guss Legacy, FNP 05/26/24 1250

## 2024-05-26 NOTE — ED Triage Notes (Signed)
 Pt c/o right upper arm pain started Friday now she has pain in her shoulder blade today also. Pt has lidocaine patch to area.
